# Patient Record
Sex: Female | Born: 1940
Health system: Southern US, Community
[De-identification: ages and names within clinical notes are randomized; demographics above are authoritative.]

## PROBLEM LIST (undated history)

## (undated) DIAGNOSIS — Z972 Presence of dental prosthetic device (complete) (partial): Secondary | ICD-10-CM

## (undated) DIAGNOSIS — R7303 Prediabetes: Secondary | ICD-10-CM

## (undated) DIAGNOSIS — I10 Essential (primary) hypertension: Secondary | ICD-10-CM

## (undated) DIAGNOSIS — M1990 Unspecified osteoarthritis, unspecified site: Secondary | ICD-10-CM

## (undated) HISTORY — DX: Prediabetes: R73.03

## (undated) HISTORY — DX: Unspecified osteoarthritis, unspecified site: M19.90

## (undated) HISTORY — DX: Essential (primary) hypertension: I10

## (undated) HISTORY — PX: TUBAL LIGATION: SHX77

## (undated) HISTORY — PX: COLONOSCOPY: SHX174

---

## 2008-04-01 ENCOUNTER — Ambulatory Visit: Payer: Self-pay | Admitting: Internal Medicine

## 2008-04-15 ENCOUNTER — Ambulatory Visit: Payer: Self-pay | Admitting: Internal Medicine

## 2011-06-30 ENCOUNTER — Ambulatory Visit: Payer: Self-pay | Admitting: Gastroenterology

## 2011-07-01 LAB — PATHOLOGY REPORT

## 2012-10-19 ENCOUNTER — Ambulatory Visit: Payer: Self-pay | Admitting: Family Medicine

## 2013-11-26 ENCOUNTER — Ambulatory Visit: Payer: Self-pay | Admitting: Family Medicine

## 2014-08-07 ENCOUNTER — Ambulatory Visit: Payer: Self-pay | Admitting: Gastroenterology

## 2014-08-07 DIAGNOSIS — Z86010 Personal history of colonic polyps: Secondary | ICD-10-CM | POA: Diagnosis not present

## 2014-08-07 DIAGNOSIS — Z1211 Encounter for screening for malignant neoplasm of colon: Secondary | ICD-10-CM | POA: Diagnosis not present

## 2014-12-03 ENCOUNTER — Other Ambulatory Visit: Payer: Self-pay | Admitting: Family Medicine

## 2014-12-03 DIAGNOSIS — Z1231 Encounter for screening mammogram for malignant neoplasm of breast: Secondary | ICD-10-CM

## 2014-12-03 DIAGNOSIS — Z0000 Encounter for general adult medical examination without abnormal findings: Secondary | ICD-10-CM | POA: Diagnosis not present

## 2014-12-03 DIAGNOSIS — E119 Type 2 diabetes mellitus without complications: Secondary | ICD-10-CM | POA: Diagnosis not present

## 2014-12-03 DIAGNOSIS — E785 Hyperlipidemia, unspecified: Secondary | ICD-10-CM | POA: Diagnosis not present

## 2014-12-03 DIAGNOSIS — I10 Essential (primary) hypertension: Secondary | ICD-10-CM | POA: Diagnosis not present

## 2014-12-17 ENCOUNTER — Other Ambulatory Visit: Payer: Self-pay | Admitting: Family Medicine

## 2014-12-17 ENCOUNTER — Ambulatory Visit
Admission: RE | Admit: 2014-12-17 | Discharge: 2014-12-17 | Disposition: A | Discharge status: Home / Self Care | Payer: Commercial Managed Care - HMO | Source: Ambulatory Visit | Attending: Family Medicine | Admitting: Family Medicine

## 2014-12-17 DIAGNOSIS — Z1231 Encounter for screening mammogram for malignant neoplasm of breast: Secondary | ICD-10-CM

## 2015-04-03 DIAGNOSIS — B9689 Other specified bacterial agents as the cause of diseases classified elsewhere: Secondary | ICD-10-CM | POA: Diagnosis not present

## 2015-04-03 DIAGNOSIS — J208 Acute bronchitis due to other specified organisms: Secondary | ICD-10-CM | POA: Diagnosis not present

## 2015-06-04 DIAGNOSIS — E119 Type 2 diabetes mellitus without complications: Secondary | ICD-10-CM | POA: Diagnosis not present

## 2015-06-11 DIAGNOSIS — I10 Essential (primary) hypertension: Secondary | ICD-10-CM | POA: Diagnosis not present

## 2015-06-11 DIAGNOSIS — N390 Urinary tract infection, site not specified: Secondary | ICD-10-CM | POA: Diagnosis not present

## 2015-06-11 DIAGNOSIS — E119 Type 2 diabetes mellitus without complications: Secondary | ICD-10-CM | POA: Diagnosis not present

## 2015-06-18 DIAGNOSIS — H5211 Myopia, right eye: Secondary | ICD-10-CM | POA: Diagnosis not present

## 2015-06-18 DIAGNOSIS — H5210 Myopia, unspecified eye: Secondary | ICD-10-CM | POA: Diagnosis not present

## 2015-11-19 ENCOUNTER — Other Ambulatory Visit: Payer: Self-pay | Admitting: Family Medicine

## 2015-11-19 DIAGNOSIS — Z1231 Encounter for screening mammogram for malignant neoplasm of breast: Secondary | ICD-10-CM

## 2015-12-03 DIAGNOSIS — E119 Type 2 diabetes mellitus without complications: Secondary | ICD-10-CM | POA: Diagnosis not present

## 2015-12-03 DIAGNOSIS — I10 Essential (primary) hypertension: Secondary | ICD-10-CM | POA: Diagnosis not present

## 2015-12-10 DIAGNOSIS — E119 Type 2 diabetes mellitus without complications: Secondary | ICD-10-CM | POA: Diagnosis not present

## 2015-12-10 DIAGNOSIS — J309 Allergic rhinitis, unspecified: Secondary | ICD-10-CM | POA: Diagnosis not present

## 2015-12-10 DIAGNOSIS — Z23 Encounter for immunization: Secondary | ICD-10-CM | POA: Diagnosis not present

## 2015-12-10 DIAGNOSIS — E785 Hyperlipidemia, unspecified: Secondary | ICD-10-CM | POA: Diagnosis not present

## 2015-12-10 DIAGNOSIS — Z0000 Encounter for general adult medical examination without abnormal findings: Secondary | ICD-10-CM | POA: Diagnosis not present

## 2015-12-10 DIAGNOSIS — I10 Essential (primary) hypertension: Secondary | ICD-10-CM | POA: Diagnosis not present

## 2015-12-18 ENCOUNTER — Ambulatory Visit
Admission: RE | Admit: 2015-12-18 | Discharge: 2015-12-18 | Disposition: A | Discharge status: Home / Self Care | Payer: Commercial Managed Care - HMO | Source: Ambulatory Visit | Attending: Family Medicine | Admitting: Family Medicine

## 2015-12-18 ENCOUNTER — Other Ambulatory Visit: Payer: Self-pay | Admitting: Family Medicine

## 2015-12-18 DIAGNOSIS — Z1231 Encounter for screening mammogram for malignant neoplasm of breast: Secondary | ICD-10-CM | POA: Diagnosis not present

## 2016-07-05 DIAGNOSIS — H43813 Vitreous degeneration, bilateral: Secondary | ICD-10-CM | POA: Diagnosis not present

## 2016-07-05 DIAGNOSIS — H524 Presbyopia: Secondary | ICD-10-CM | POA: Diagnosis not present

## 2016-07-05 DIAGNOSIS — H52223 Regular astigmatism, bilateral: Secondary | ICD-10-CM | POA: Diagnosis not present

## 2016-07-05 DIAGNOSIS — H5202 Hypermetropia, left eye: Secondary | ICD-10-CM | POA: Diagnosis not present

## 2016-07-05 DIAGNOSIS — H25813 Combined forms of age-related cataract, bilateral: Secondary | ICD-10-CM | POA: Diagnosis not present

## 2016-07-05 DIAGNOSIS — R739 Hyperglycemia, unspecified: Secondary | ICD-10-CM | POA: Diagnosis not present

## 2016-07-05 DIAGNOSIS — H5211 Myopia, right eye: Secondary | ICD-10-CM | POA: Diagnosis not present

## 2016-08-26 DIAGNOSIS — J0100 Acute maxillary sinusitis, unspecified: Secondary | ICD-10-CM | POA: Diagnosis not present

## 2016-11-11 ENCOUNTER — Other Ambulatory Visit: Payer: Self-pay | Admitting: Family Medicine

## 2016-11-11 DIAGNOSIS — Z1231 Encounter for screening mammogram for malignant neoplasm of breast: Secondary | ICD-10-CM

## 2016-11-21 DIAGNOSIS — I10 Essential (primary) hypertension: Secondary | ICD-10-CM | POA: Diagnosis not present

## 2016-11-21 DIAGNOSIS — R42 Dizziness and giddiness: Secondary | ICD-10-CM | POA: Diagnosis not present

## 2016-12-05 DIAGNOSIS — Z0000 Encounter for general adult medical examination without abnormal findings: Secondary | ICD-10-CM | POA: Diagnosis not present

## 2016-12-05 DIAGNOSIS — E119 Type 2 diabetes mellitus without complications: Secondary | ICD-10-CM | POA: Diagnosis not present

## 2016-12-12 DIAGNOSIS — E785 Hyperlipidemia, unspecified: Secondary | ICD-10-CM | POA: Diagnosis not present

## 2016-12-12 DIAGNOSIS — E119 Type 2 diabetes mellitus without complications: Secondary | ICD-10-CM | POA: Diagnosis not present

## 2016-12-12 DIAGNOSIS — I10 Essential (primary) hypertension: Secondary | ICD-10-CM | POA: Diagnosis not present

## 2016-12-12 DIAGNOSIS — Z0000 Encounter for general adult medical examination without abnormal findings: Secondary | ICD-10-CM | POA: Diagnosis not present

## 2016-12-19 ENCOUNTER — Encounter (INDEPENDENT_AMBULATORY_CARE_PROVIDER_SITE_OTHER): Payer: Self-pay

## 2016-12-19 ENCOUNTER — Ambulatory Visit
Admission: RE | Admit: 2016-12-19 | Discharge: 2016-12-19 | Disposition: A | Discharge status: Home / Self Care | Payer: Medicare HMO | Source: Ambulatory Visit | Attending: Family Medicine | Admitting: Family Medicine

## 2016-12-19 DIAGNOSIS — Z1231 Encounter for screening mammogram for malignant neoplasm of breast: Secondary | ICD-10-CM | POA: Diagnosis not present

## 2017-11-10 ENCOUNTER — Other Ambulatory Visit: Payer: Self-pay | Admitting: Family Medicine

## 2017-11-10 DIAGNOSIS — Z1231 Encounter for screening mammogram for malignant neoplasm of breast: Secondary | ICD-10-CM

## 2017-12-20 ENCOUNTER — Encounter (INDEPENDENT_AMBULATORY_CARE_PROVIDER_SITE_OTHER): Payer: Self-pay

## 2017-12-20 ENCOUNTER — Ambulatory Visit
Admission: RE | Admit: 2017-12-20 | Discharge: 2017-12-20 | Disposition: A | Discharge status: Home / Self Care | Payer: Medicare Other | Source: Ambulatory Visit | Attending: Family Medicine | Admitting: Family Medicine

## 2017-12-20 ENCOUNTER — Inpatient Hospital Stay: Admission: RE | Admit: 2017-12-20 | Payer: Medicare HMO | Source: Ambulatory Visit

## 2017-12-20 DIAGNOSIS — Z1231 Encounter for screening mammogram for malignant neoplasm of breast: Secondary | ICD-10-CM | POA: Insufficient documentation

## 2018-11-27 ENCOUNTER — Other Ambulatory Visit: Payer: Self-pay | Admitting: Family Medicine

## 2018-11-27 DIAGNOSIS — Z1231 Encounter for screening mammogram for malignant neoplasm of breast: Secondary | ICD-10-CM

## 2018-12-25 ENCOUNTER — Ambulatory Visit
Admission: RE | Admit: 2018-12-25 | Discharge: 2018-12-25 | Disposition: A | Discharge status: Home / Self Care | Payer: Medicare Other | Source: Ambulatory Visit | Attending: Family Medicine | Admitting: Family Medicine

## 2018-12-25 ENCOUNTER — Other Ambulatory Visit: Payer: Self-pay

## 2018-12-25 DIAGNOSIS — Z1231 Encounter for screening mammogram for malignant neoplasm of breast: Secondary | ICD-10-CM | POA: Insufficient documentation

## 2019-07-23 ENCOUNTER — Ambulatory Visit: Payer: Self-pay | Admitting: Family Medicine

## 2019-08-01 ENCOUNTER — Ambulatory Visit (INDEPENDENT_AMBULATORY_CARE_PROVIDER_SITE_OTHER): Payer: Medicare Other | Admitting: Family Medicine

## 2019-08-01 ENCOUNTER — Other Ambulatory Visit: Payer: Self-pay

## 2019-08-01 ENCOUNTER — Other Ambulatory Visit: Payer: Self-pay | Admitting: Family Medicine

## 2019-08-01 ENCOUNTER — Encounter: Payer: Self-pay | Admitting: Family Medicine

## 2019-08-01 VITALS — BP 162/89 | HR 93 | Temp 98.1°F | Ht 65.5 in | Wt 197.0 lb

## 2019-08-01 DIAGNOSIS — Z7689 Persons encountering health services in other specified circumstances: Secondary | ICD-10-CM

## 2019-08-01 DIAGNOSIS — R7301 Impaired fasting glucose: Secondary | ICD-10-CM | POA: Diagnosis not present

## 2019-08-01 DIAGNOSIS — I10 Essential (primary) hypertension: Secondary | ICD-10-CM | POA: Diagnosis not present

## 2019-08-01 MED ORDER — AMLODIPINE BESYLATE 5 MG PO TABS: 5.0000 mg | ORAL_TABLET | Freq: Every day | ORAL | 0 refills | Status: DC

## 2019-08-01 NOTE — Assessment & Plan Note (Signed)
BPs frequently above goal. Increase to 5 mg amlodipine and recheck in 1 month. Start logging home readings. Call with persistent abnormal readings

## 2019-08-01 NOTE — Assessment & Plan Note (Signed)
Diet controlled. Continue excellent lifestyle modifications, repeat labs at upcoming appt

## 2019-08-01 NOTE — Progress Notes (Signed)
   BP (!) 162/89   Pulse 93   Temp 98.1 F (36.7 C) (Oral)   Ht 5' 5.5" (1.664 m)   Wt 197 lb (89.4 kg)   SpO2 99%   BMI 32.28 kg/m    Subjective:    Patient ID: Carol Armstrong, female    DOB: 1941/01/13, 79 y.o.   MRN: [id number]  HPI: Carol Armstrong is a 79 y.o. female  Chief Complaint  Patient presents with  . Establish Care   Patient presenting today to establish care.   PMHx of HTN and IFG. On low dose amlodipine and a multivitamin. BSs controlled with diet. Denies CP, SOB, HAs, dizziness, low blood sugar spells, polyuria, polydipsia. Last labs stable 3 months ago. Of note, does not check home BPs but per past record review BPs running high normal to high.   No new concerns today. Overdue for CPE.   Relevant past medical, surgical, family and social history reviewed and updated as indicated. Interim medical history since our last visit reviewed. Allergies and medications reviewed and updated.  Review of Systems  Per HPI unless specifically indicated above     Objective:    BP (!) 162/89   Pulse 93   Temp 98.1 F (36.7 C) (Oral)   Ht 5' 5.5" (1.664 m)   Wt 197 lb (89.4 kg)   SpO2 99%   BMI 32.28 kg/m   Wt Readings from Last 3 Encounters:  08/01/19 197 lb (89.4 kg)    Physical Exam Vitals and nursing note reviewed.  Constitutional:      Appearance: Normal appearance. She is not ill-appearing.  HENT:     Head: Atraumatic.  Eyes:     Extraocular Movements: Extraocular movements intact.     Conjunctiva/sclera: Conjunctivae normal.  Cardiovascular:     Rate and Rhythm: Normal rate and regular rhythm.     Heart sounds: Normal heart sounds.  Pulmonary:     Effort: Pulmonary effort is normal.     Breath sounds: Normal breath sounds.  Musculoskeletal:        General: Normal range of motion.     Cervical back: Normal range of motion and neck supple.  Skin:    General: Skin is warm and dry.  Neurological:     Mental Status: She is alert and oriented to  person, place, and time.  Psychiatric:        Mood and Affect: Mood normal.        Thought Content: Thought content normal.        Judgment: Judgment normal.     No results found for this or any previous visit.    Assessment & Plan:   Problem List Items Addressed This Visit      Cardiovascular and Mediastinum   Essential hypertension - Primary    BPs frequently above goal. Increase to 5 mg amlodipine and recheck in 1 month. Start logging home readings. Call with persistent abnormal readings        Endocrine   IFG (impaired fasting glucose)    Diet controlled. Continue excellent lifestyle modifications, repeat labs at upcoming appt       Other Visit Diagnoses    Encounter to establish care           Follow up plan: Return in about 4 weeks (around 08/29/2019) for CPE.

## 2019-08-28 ENCOUNTER — Other Ambulatory Visit: Payer: Self-pay | Admitting: Family Medicine

## 2019-08-28 ENCOUNTER — Telehealth: Payer: Self-pay

## 2019-08-28 DIAGNOSIS — Z1211 Encounter for screening for malignant neoplasm of colon: Secondary | ICD-10-CM

## 2019-08-28 NOTE — Telephone Encounter (Signed)
Gastroenterology Pre-Procedure Review  Request Date: 10/07/19 Requesting Physician: Dr. Allen Norris  PATIENT REVIEW QUESTIONS: The patient responded to the following health history questions as indicated:    1. Are you having any GI issues? no 2. Do you have a personal history of Polyps? no 3. Do you have a family history of Colon Cancer or Polyps? no 4. Diabetes Mellitus? no 5. Joint replacements in the past 12 months?no 6. Major health problems in the past 3 months?no 7. Any artificial heart valves, MVP, or defibrillator?no    MEDICATIONS & ALLERGIES:    Patient reports the following regarding taking any anticoagulation/antiplatelet therapy:   Plavix, Coumadin, Eliquis, Xarelto, Lovenox, Pradaxa, Brilinta, or Effient? no Aspirin? no  Patient confirms/reports the following medications:  Current Outpatient Medications  Medication Sig Dispense Refill  . amLODipine (NORVASC) 5 MG tablet TAKE 1 TABLET(5 MG) BY MOUTH DAILY 90 tablet 0  . Multiple Vitamin (MULTIVITAMIN) capsule Take by mouth.     No current facility-administered medications for this visit.    Patient confirms/reports the following allergies:  Allergies  Allergen Reactions  . Latex Itching and Swelling    No orders of the defined types were placed in this encounter.   AUTHORIZATION INFORMATION Primary Insurance: 1D#: Group #:  Secondary Insurance: 1D#: Group #:  SCHEDULE INFORMATION: Date: 10/07/19 Time: Location:[facility]

## 2019-08-28 NOTE — Telephone Encounter (Signed)
Returned patients call to schedule her colonoscopy.  After scheduling, realized we did not have a referral for her.  I've sent a secure chat to Merrie Roof her PCP to send referral for her colonoscopy.  Thanks,  [place], [place]

## 2019-08-29 ENCOUNTER — Other Ambulatory Visit: Payer: Self-pay

## 2019-08-29 ENCOUNTER — Ambulatory Visit (INDEPENDENT_AMBULATORY_CARE_PROVIDER_SITE_OTHER): Payer: Medicare Other | Admitting: Family Medicine

## 2019-08-29 ENCOUNTER — Encounter: Payer: Self-pay | Admitting: Family Medicine

## 2019-08-29 VITALS — BP 144/80 | HR 96 | Temp 98.0°F | Ht 66.0 in | Wt 198.0 lb

## 2019-08-29 DIAGNOSIS — Z6831 Body mass index (BMI) 31.0-31.9, adult: Secondary | ICD-10-CM | POA: Diagnosis not present

## 2019-08-29 DIAGNOSIS — E6609 Other obesity due to excess calories: Secondary | ICD-10-CM

## 2019-08-29 DIAGNOSIS — Z23 Encounter for immunization: Secondary | ICD-10-CM | POA: Diagnosis not present

## 2019-08-29 DIAGNOSIS — R7301 Impaired fasting glucose: Secondary | ICD-10-CM | POA: Diagnosis not present

## 2019-08-29 DIAGNOSIS — E669 Obesity, unspecified: Secondary | ICD-10-CM | POA: Insufficient documentation

## 2019-08-29 DIAGNOSIS — I10 Essential (primary) hypertension: Secondary | ICD-10-CM

## 2019-08-29 DIAGNOSIS — Z0000 Encounter for general adult medical examination without abnormal findings: Secondary | ICD-10-CM

## 2019-08-29 DIAGNOSIS — Z1211 Encounter for screening for malignant neoplasm of colon: Secondary | ICD-10-CM

## 2019-08-29 NOTE — Progress Notes (Signed)
BP (!) 144/80   Pulse 96   Temp 98 F (36.7 C)   Ht 5\' 6"  (1.676 m)   Wt 198 lb (89.8 kg)   SpO2 97%   BMI 31.96 kg/m    Subjective:    Patient ID: Carol Armstrong, female    DOB: [birth year], 79 y.o.   MRN: [id number]  HPI: Carol Armstrong is a 79 y.o. female presenting on 08/29/2019 for comprehensive medical examination. Current medical complaints include:see below  HTN - Home readings running 120-130/80s typically with some occasional high readings. Taking amlodipine faithfully without side effects. Denies CP, SOB, HAs, dizziness.   IFG - Diet controlled, not checking home sugars. Eating well and staying very active.   She currently lives with: Menopausal Symptoms: no  Depression Screen done today and results listed below:  Depression screen [facility] 2/9 08/29/2019 08/01/2019  Decreased Interest 0 0  Down, Depressed, Hopeless 0 0  PHQ - 2 Score 0 0  Altered sleeping 0 0  Tired, decreased energy 0 0  Change in appetite 0 0  Feeling bad or failure about yourself  0 0  Trouble concentrating 0 0  Moving slowly or fidgety/restless 0 0  Suicidal thoughts 0 0  PHQ-9 Score 0 0    The patient does not have a history of falls. I did complete a risk assessment for falls. A plan of care for falls was documented.   Past Medical History:  Past Medical History:  Diagnosis Date  . Hypertension   . Prediabetes     Surgical History:  Past Surgical History:  Procedure Laterality Date  . COLONOSCOPY    . TUBAL LIGATION      Medications:  Current Outpatient Medications on File Prior to Visit  Medication Sig  . amLODipine (NORVASC) 5 MG tablet TAKE 1 TABLET(5 MG) BY MOUTH DAILY  . Multiple Vitamin (MULTIVITAMIN) capsule Take by mouth.   No current facility-administered medications on file prior to visit.    Allergies:  Allergies  Allergen Reactions  . Latex Itching and Swelling    Social History:  Social History   Socioeconomic History  . Marital status: Divorced   Spouse name: Not on file  . Number of children: Not on file  . Years of education: Not on file  . Highest education level: Not on file  Occupational History  . Not on file  Tobacco Use  . Smoking status: Former Research scientist (life sciences)  . Smokeless tobacco: Never Used  Substance and Sexual Activity  . Alcohol use: Yes    Comment: occasinally wine  . Drug use: Never  . Sexual activity: Not Currently  Other Topics Concern  . Not on file  Social History Narrative  . Not on file   Social Determinants of Health   Financial Resource Strain:   . Difficulty of Paying Living Expenses:   Food Insecurity:   . Worried About Charity fundraiser in the Last Year:   . Arboriculturist in the Last Year:   Transportation Needs:   . Film/video editor (Medical):   Marland Kitchen Lack of Transportation (Non-Medical):   Physical Activity:   . Days of Exercise per Week:   . Minutes of Exercise per Session:   Stress:   . Feeling of Stress :   Social Connections:   . Frequency of Communication with Friends and Family:   . Frequency of Social Gatherings with Friends and Family:   . Attends Religious Services:   . Active Member  of Clubs or Organizations:   . Attends Archivist Meetings:   Marland Kitchen Marital Status:   Intimate Partner Violence:   . Fear of Current or Ex-Partner:   . Emotionally Abused:   Marland Kitchen Physically Abused:   . Sexually Abused:    Social History   Tobacco Use  Smoking Status Former Smoker  Smokeless Tobacco Never Used   Social History   Substance and Sexual Activity  Alcohol Use Yes   Comment: occasinally wine    Family History:  Family History  Problem Relation Age of Onset  . Hypertension Mother   . Prostate cancer Father   . Hypertension Sister   . Hypertension Brother   . Hypertension Sister   . Hypertension Sister   . Hypertension Sister   . Breast cancer Neg Hx     Past medical history, surgical history, medications, allergies, family history and social history reviewed  with patient today and changes made to appropriate areas of the chart.   Review of Systems - General ROS: negative Psychological ROS: negative Ophthalmic ROS: negative ENT ROS: negative Allergy and Immunology ROS: negative Hematological and Lymphatic ROS: negative Endocrine ROS: negative Breast ROS: negative for breast lumps Respiratory ROS: no cough, shortness of breath, or wheezing Cardiovascular ROS: no chest pain or dyspnea on exertion Gastrointestinal ROS: no abdominal pain, change in bowel habits, or black or bloody stools Genito-Urinary ROS: no dysuria, trouble voiding, or hematuria Musculoskeletal ROS: negative Neurological ROS: no TIA or stroke symptoms Dermatological ROS: negative All other ROS negative except what is listed above and in the HPI.      Objective:    BP (!) 144/80   Pulse 96   Temp 98 F (36.7 C)   Ht 5\' 6"  (1.676 m)   Wt 198 lb (89.8 kg)   SpO2 97%   BMI 31.96 kg/m   Wt Readings from Last 3 Encounters:  08/29/19 198 lb (89.8 kg)  08/01/19 197 lb (89.4 kg)    Physical Exam Vitals and nursing note reviewed.  Constitutional:      General: She is not in acute distress.    Appearance: She is well-developed.  HENT:     Head: Atraumatic.     Right Ear: External ear normal.     Left Ear: External ear normal.     Nose: Nose normal.     Mouth/Throat:     Pharynx: No oropharyngeal exudate.  Eyes:     General: No scleral icterus.    Conjunctiva/sclera: Conjunctivae normal.     Pupils: Pupils are equal, round, and reactive to light.  Neck:     Thyroid: No thyromegaly.  Cardiovascular:     Rate and Rhythm: Normal rate and regular rhythm.     Heart sounds: Normal heart sounds.  Pulmonary:     Effort: Pulmonary effort is normal. No respiratory distress.     Breath sounds: Normal breath sounds.  Chest:     Breasts:        Right: No mass, skin change or tenderness.        Left: No mass, skin change or tenderness.  Abdominal:     General: Bowel  sounds are normal.     Palpations: Abdomen is soft. There is no mass.     Tenderness: There is no abdominal tenderness.  Genitourinary:    Comments: Gu exam declined Musculoskeletal:        General: No tenderness. Normal range of motion.     Cervical back: Normal range  of motion and neck supple.  Lymphadenopathy:     Cervical: No cervical adenopathy.     Upper Body:     Right upper body: No axillary adenopathy.     Left upper body: No axillary adenopathy.  Skin:    General: Skin is warm and dry.     Findings: No rash.  Neurological:     Mental Status: She is alert and oriented to person, place, and time.     Cranial Nerves: No cranial nerve deficit.  Psychiatric:        Behavior: Behavior normal.     Results for orders placed or performed in visit on 08/29/19  Microscopic Examination   URINE  Result Value Ref Range   WBC, UA 0-5 0 - 5 /hpf   RBC 0-2 0 - 2 /hpf   Epithelial Cells (non renal) 0-10 0 - 10 /hpf   Bacteria, UA Few (A) None seen/Few  Urine Culture, Reflex   URINE  Result Value Ref Range   Urine Culture, Routine Final report    Organism ID, Bacteria Comment   CBC with Differential/Platelet  Result Value Ref Range   WBC 5.9 3.4 - 10.8 x10E3/uL   RBC 4.73 3.77 - 5.28 x10E6/uL   Hemoglobin 14.3 11.1 - 15.9 g/dL   Hematocrit 41.8 34.0 - 46.6 %   MCV 88 79 - 97 fL   MCH 30.2 26.6 - 33.0 pg   MCHC 34.2 31.5 - 35.7 g/dL   RDW 12.6 11.7 - 15.4 %   Platelets 206 150 - 450 x10E3/uL   Neutrophils 63 Not Estab. %   Lymphs 26 Not Estab. %   Monocytes 7 Not Estab. %   Eos 3 Not Estab. %   Basos 1 Not Estab. %   Neutrophils Absolute 3.7 1.4 - 7.0 x10E3/uL   Lymphocytes Absolute 1.5 0.7 - 3.1 x10E3/uL   Monocytes Absolute 0.4 0.1 - 0.9 x10E3/uL   EOS (ABSOLUTE) 0.2 0.0 - 0.4 x10E3/uL   Basophils Absolute 0.0 0.0 - 0.2 x10E3/uL   Immature Granulocytes 0 Not Estab. %   Immature Grans (Abs) 0.0 0.0 - 0.1 x10E3/uL  Comprehensive metabolic panel  Result Value Ref  Range   Glucose 92 65 - 99 mg/dL   BUN 14 8 - 27 mg/dL   Creatinine, Ser 1.01 (H) 0.57 - 1.00 mg/dL   GFR calc non Af Amer 54 (L) >59 mL/min/1.73   GFR calc Af Amer 62 >59 mL/min/1.73   BUN/Creatinine Ratio 14 12 - 28   Sodium 144 134 - 144 mmol/L   Potassium 3.9 3.5 - 5.2 mmol/L   Chloride 106 96 - 106 mmol/L   CO2 27 20 - 29 mmol/L   Calcium 9.0 8.7 - 10.3 mg/dL   Total Protein 6.9 6.0 - 8.5 g/dL   Albumin 4.3 3.7 - 4.7 g/dL   Globulin, Total 2.6 1.5 - 4.5 g/dL   Albumin/Globulin Ratio 1.7 1.2 - 2.2   Bilirubin Total 0.7 0.0 - 1.2 mg/dL   Alkaline Phosphatase 58 39 - 117 IU/L   AST 23 0 - 40 IU/L   ALT 21 0 - 32 IU/L  Lipid Panel w/o Chol/HDL Ratio  Result Value Ref Range   Cholesterol, Total 188 100 - 199 mg/dL   Triglycerides 122 0 - 149 mg/dL   HDL 60 >39 mg/dL   VLDL Cholesterol Cal 22 5 - 40 mg/dL   LDL Chol Calc (NIH) 106 (H) 0 - 99 mg/dL  TSH  Result Value Ref  Range   TSH 1.910 0.450 - 4.500 uIU/mL  UA/M w/rflx Culture, Routine   Specimen: Urine   URINE  Result Value Ref Range   Specific Gravity, UA 1.025 1.005 - 1.030   pH, UA 7.0 5.0 - 7.5   Color, UA Yellow Yellow   Appearance Ur Clear Clear   Leukocytes,UA Trace (A) Negative   Protein,UA Negative Negative/Trace   Glucose, UA Negative Negative   Ketones, UA Negative Negative   RBC, UA Trace (A) Negative   Bilirubin, UA Negative Negative   Urobilinogen, Ur 0.2 0.2 - 1.0 mg/dL   Nitrite, UA Negative Negative   Microscopic Examination See below:    Urinalysis Reflex Comment   HgB A1c  Result Value Ref Range   Hgb A1c MFr Bld 6.0 (H) 4.8 - 5.6 %   Est. average glucose Bld gHb Est-mCnc 126 mg/dL  Microalbumin, Urine Waived  Result Value Ref Range   Microalb, Ur Waived 30 (H) 0 - 19 mg/L   Creatinine, Urine Waived 100 10 - 300 mg/dL   Microalb/Creat Ratio <30 <30 mg/g      Assessment & Plan:   Problem List Items Addressed This Visit      Cardiovascular and Mediastinum   Essential hypertension -  Primary    BPs high normal to just above normal range. Increase to 5 mg amlodipine and continue to monitor. DASH diet, exercise      Relevant Orders   CBC with Differential/Platelet (Completed)   Comprehensive metabolic panel (Completed)   TSH (Completed)   UA/M w/rflx Culture, Routine (Completed)     Endocrine   IFG (impaired fasting glucose)    Recheck A1C, adjust as needed. Continue good diet and exercise      Relevant Orders   HgB A1c (Completed)   Microalbumin, Urine Waived (Completed)     Other   Obesity    Check lipids, continue good diet and exercise habits      Relevant Orders   Lipid Panel w/o Chol/HDL Ratio (Completed)    Other Visit Diagnoses    Annual physical exam       Need for pneumococcal vaccine       Relevant Orders   Pneumococcal polysaccharide vaccine 23-valent greater than or equal to 2yo subcutaneous/IM (Completed)       Follow up plan: Return in about 6 months (around 02/29/2020) for 6 month f/u.   LABORATORY TESTING:  - Pap smear: not applicable  IMMUNIZATIONS:   - Tdap: Tetanus vaccination status reviewed: postponed. - Influenza: Up to date - Pneumovax: Up to date - Prevnar: Up to date - HPV: Not applicable - Zostavax vaccine: postponed  SCREENING: -Mammogram: Up to date  - Colonoscopy: postponed  - Bone Density: Up to date   PATIENT COUNSELING:   Advised to take 1 mg of folate supplement per day if capable of pregnancy.   Sexuality: Discussed sexually transmitted diseases, partner selection, use of condoms, avoidance of unintended pregnancy  and contraceptive alternatives.   Advised to avoid cigarette smoking.  I discussed with the patient that most people either abstain from alcohol or drink within safe limits (<=14/week and <=4 drinks/occasion for males, <=7/weeks and <= 3 drinks/occasion for females) and that the risk for alcohol disorders and other health effects rises proportionally with the number of drinks per week and how  often a drinker exceeds daily limits.  Discussed cessation/primary prevention of drug use and availability of treatment for abuse.   Diet: Encouraged to adjust caloric intake to  maintain  or achieve ideal body weight, to reduce intake of dietary saturated fat and total fat, to limit sodium intake by avoiding high sodium foods and not adding table salt, and to maintain adequate dietary potassium and calcium preferably from fresh fruits, vegetables, and low-fat dairy products.    stressed the importance of regular exercise  Injury prevention: Discussed safety belts, safety helmets, smoke detector, smoking near bedding or upholstery.   Dental health: Discussed importance of regular tooth brushing, flossing, and dental visits.    NEXT PREVENTATIVE PHYSICAL DUE IN 1 YEAR. Return in about 6 months (around 02/29/2020) for 6 month f/u.

## 2019-08-30 LAB — COMPREHENSIVE METABOLIC PANEL
ALT: 21 IU/L (ref 0–32)
AST: 23 IU/L (ref 0–40)
Albumin/Globulin Ratio: 1.7 (ref 1.2–2.2)
Albumin: 4.3 g/dL (ref 3.7–4.7)
Alkaline Phosphatase: 58 IU/L (ref 39–117)
BUN/Creatinine Ratio: 14 (ref 12–28)
BUN: 14 mg/dL (ref 8–27)
Bilirubin Total: 0.7 mg/dL (ref 0.0–1.2)
CO2: 27 mmol/L (ref 20–29)
Calcium: 9 mg/dL (ref 8.7–10.3)
Chloride: 106 mmol/L (ref 96–106)
Creatinine, Ser: 1.01 mg/dL — ABNORMAL HIGH (ref 0.57–1.00)
GFR calc Af Amer: 62 mL/min/{1.73_m2} (ref >59)
GFR calc non Af Amer: 54 mL/min/{1.73_m2} — ABNORMAL LOW (ref >59)
Globulin, Total: 2.6 g/dL (ref 1.5–4.5)
Glucose: 92 mg/dL (ref 65–99)
Potassium: 3.9 mmol/L (ref 3.5–5.2)
Sodium: 144 mmol/L (ref 134–144)
Total Protein: 6.9 g/dL (ref 6.0–8.5)

## 2019-08-30 LAB — CBC WITH DIFFERENTIAL/PLATELET
Basophils Absolute: 0 10*3/uL (ref 0.0–0.2)
Basos: 1 %
EOS (ABSOLUTE): 0.2 10*3/uL (ref 0.0–0.4)
Eos: 3 %
Hematocrit: 41.8 % (ref 34.0–46.6)
Hemoglobin: 14.3 g/dL (ref 11.1–15.9)
Immature Grans (Abs): 0 10*3/uL (ref 0.0–0.1)
Immature Granulocytes: 0 %
Lymphocytes Absolute: 1.5 10*3/uL (ref 0.7–3.1)
Lymphs: 26 %
MCH: 30.2 pg (ref 26.6–33.0)
MCHC: 34.2 g/dL (ref 31.5–35.7)
MCV: 88 fL (ref 79–97)
Monocytes Absolute: 0.4 10*3/uL (ref 0.1–0.9)
Monocytes: 7 %
Neutrophils Absolute: 3.7 10*3/uL (ref 1.4–7.0)
Neutrophils: 63 %
Platelets: 206 10*3/uL (ref 150–450)
RBC: 4.73 x10E6/uL (ref 3.77–5.28)
RDW: 12.6 % (ref 11.7–15.4)
WBC: 5.9 10*3/uL (ref 3.4–10.8)

## 2019-08-30 LAB — HEMOGLOBIN A1C
Est. average glucose Bld gHb Est-mCnc: 126 mg/dL
Hgb A1c MFr Bld: 6 % — ABNORMAL HIGH (ref 4.8–5.6)

## 2019-08-30 LAB — LIPID PANEL W/O CHOL/HDL RATIO
Cholesterol, Total: 188 mg/dL (ref 100–199)
HDL: 60 mg/dL (ref >39)
LDL Chol Calc (NIH): 106 mg/dL — ABNORMAL HIGH (ref 0–99)
Triglycerides: 122 mg/dL (ref 0–149)
VLDL Cholesterol Cal: 22 mg/dL (ref 5–40)

## 2019-08-30 LAB — TSH: TSH: 1.91 u[IU]/mL (ref 0.450–4.500)

## 2019-08-31 LAB — MICROSCOPIC EXAMINATION

## 2019-08-31 LAB — MICROALBUMIN, URINE WAIVED
Creatinine, Urine Waived: 100 mg/dL (ref 10–300)
Microalb, Ur Waived: 30 mg/L — ABNORMAL HIGH (ref 0–19)
Microalb/Creat Ratio: <30 mg/g (ref <30)

## 2019-08-31 LAB — UA/M W/RFLX CULTURE, ROUTINE
Bilirubin, UA: NEGATIVE
Glucose, UA: NEGATIVE
Ketones, UA: NEGATIVE
Nitrite, UA: NEGATIVE
Protein,UA: NEGATIVE
Specific Gravity, UA: 1.025 (ref 1.005–1.030)
Urobilinogen, Ur: 0.2 mg/dL (ref 0.2–1.0)
pH, UA: 7 (ref 5.0–7.5)

## 2019-08-31 LAB — URINE CULTURE, REFLEX

## 2019-09-03 NOTE — Assessment & Plan Note (Signed)
BPs high normal to just above normal range. Increase to 5 mg amlodipine and continue to monitor. DASH diet, exercise

## 2019-09-03 NOTE — Assessment & Plan Note (Addendum)
Recheck A1C, adjust as needed. Continue good diet and exercise 

## 2019-09-03 NOTE — Assessment & Plan Note (Signed)
Check lipids, continue good diet and exercise habits

## 2019-09-30 ENCOUNTER — Encounter: Payer: Self-pay | Admitting: Gastroenterology

## 2019-09-30 ENCOUNTER — Other Ambulatory Visit: Payer: Self-pay

## 2019-10-03 ENCOUNTER — Other Ambulatory Visit
Admission: RE | Admit: 2019-10-03 | Discharge: 2019-10-03 | Disposition: A | Discharge status: Home / Self Care | Payer: Medicare Other | Source: Ambulatory Visit | Attending: Gastroenterology | Admitting: Gastroenterology

## 2019-10-03 ENCOUNTER — Other Ambulatory Visit: Payer: Self-pay

## 2019-10-03 DIAGNOSIS — Z01812 Encounter for preprocedural laboratory examination: Secondary | ICD-10-CM | POA: Diagnosis present

## 2019-10-03 DIAGNOSIS — Z20822 Contact with and (suspected) exposure to covid-19: Secondary | ICD-10-CM | POA: Diagnosis not present

## 2019-10-03 LAB — SARS CORONAVIRUS 2 (TAT 6-24 HRS): SARS Coronavirus 2: NEGATIVE

## 2019-10-04 NOTE — Discharge Instructions (Signed)
General Anesthesia, Adult, Care After This sheet gives you information about how to care for yourself after your procedure. Your health care provider may also give you more specific instructions. If you have problems or questions, contact your health care provider. What can I expect after the procedure? After the procedure, the following side effects are common:  Pain or discomfort at the IV site.  Nausea.  Vomiting.  Sore throat.  Trouble concentrating.  Feeling cold or chills.  Weak or tired.  Sleepiness and fatigue.  Soreness and body aches. These side effects can affect parts of the body that were not involved in surgery. Follow these instructions at home:  For at least 24 hours after the procedure:  Have a responsible adult stay with you. It is important to have someone help care for you until you are awake and alert.  Rest as needed.  Do not: ? Participate in activities in which you could fall or become injured. ? Drive. ? Use heavy machinery. ? Drink alcohol. ? Take sleeping pills or medicines that cause drowsiness. ? Make important decisions or sign legal documents. ? Take care of children on your own. Eating and drinking  Follow any instructions from your health care provider about eating or drinking restrictions.  When you feel hungry, start by eating small amounts of foods that are soft and easy to digest (bland), such as toast. Gradually return to your regular diet.  Drink enough fluid to keep your urine pale yellow.  If you vomit, rehydrate by drinking water, juice, or clear broth. General instructions  If you have sleep apnea, surgery and certain medicines can increase your risk for breathing problems. Follow instructions from your health care provider about wearing your sleep device: ? Anytime you are sleeping, including during daytime naps. ? While taking prescription pain medicines, sleeping medicines, or medicines that make you drowsy.  Return to  your normal activities as told by your health care provider. Ask your health care provider what activities are safe for you.  Take over-the-counter and prescription medicines only as told by your health care provider.  If you smoke, do not smoke without supervision.  Keep all follow-up visits as told by your health care provider. This is important. Contact a health care provider if:  You have nausea or vomiting that does not get better with medicine.  You cannot eat or drink without vomiting.  You have pain that does not get better with medicine.  You are unable to pass urine.  You develop a skin rash.  You have a fever.  You have redness around your IV site that gets worse. Get help right away if:  You have difficulty breathing.  You have chest pain.  You have blood in your urine or stool, or you vomit blood. Summary  After the procedure, it is common to have a sore throat or nausea. It is also common to feel tired.  Have a responsible adult stay with you for the first 24 hours after general anesthesia. It is important to have someone help care for you until you are awake and alert.  When you feel hungry, start by eating small amounts of foods that are soft and easy to digest (bland), such as toast. Gradually return to your regular diet.  Drink enough fluid to keep your urine pale yellow.  Return to your normal activities as told by your health care provider. Ask your health care provider what activities are safe for you. This information is not   intended to replace advice given to you by your health care provider. Make sure you discuss any questions you have with your health care provider. Document Revised: 06/09/2017 Document Reviewed: 01/20/2017 Elsevier Patient Education  2020 [employer]  

## 2019-10-07 ENCOUNTER — Encounter
Admission: RE | Disposition: A | Discharge status: Home / Self Care | Payer: Self-pay | Source: Home / Self Care | Attending: Gastroenterology

## 2019-10-07 ENCOUNTER — Other Ambulatory Visit: Payer: Self-pay

## 2019-10-07 ENCOUNTER — Ambulatory Visit
Admission: RE | Admit: 2019-10-07 | Discharge: 2019-10-07 | Disposition: A | Discharge status: Home / Self Care | Payer: Medicare Other | Attending: Gastroenterology | Admitting: Gastroenterology

## 2019-10-07 ENCOUNTER — Ambulatory Visit: Payer: Medicare Other | Admitting: Anesthesiology

## 2019-10-07 ENCOUNTER — Encounter: Payer: Self-pay | Admitting: Gastroenterology

## 2019-10-07 DIAGNOSIS — Z79899 Other long term (current) drug therapy: Secondary | ICD-10-CM | POA: Diagnosis not present

## 2019-10-07 DIAGNOSIS — Z1211 Encounter for screening for malignant neoplasm of colon: Secondary | ICD-10-CM | POA: Diagnosis present

## 2019-10-07 DIAGNOSIS — R7303 Prediabetes: Secondary | ICD-10-CM | POA: Insufficient documentation

## 2019-10-07 DIAGNOSIS — Z91040 Latex allergy status: Secondary | ICD-10-CM | POA: Diagnosis not present

## 2019-10-07 DIAGNOSIS — K640 First degree hemorrhoids: Secondary | ICD-10-CM | POA: Diagnosis not present

## 2019-10-07 DIAGNOSIS — Z6832 Body mass index (BMI) 32.0-32.9, adult: Secondary | ICD-10-CM | POA: Insufficient documentation

## 2019-10-07 DIAGNOSIS — Z860100 Personal history of colon polyps, unspecified: Secondary | ICD-10-CM

## 2019-10-07 DIAGNOSIS — I10 Essential (primary) hypertension: Secondary | ICD-10-CM | POA: Insufficient documentation

## 2019-10-07 DIAGNOSIS — D124 Benign neoplasm of descending colon: Secondary | ICD-10-CM | POA: Diagnosis not present

## 2019-10-07 DIAGNOSIS — D123 Benign neoplasm of transverse colon: Secondary | ICD-10-CM | POA: Diagnosis not present

## 2019-10-07 DIAGNOSIS — Z87891 Personal history of nicotine dependence: Secondary | ICD-10-CM | POA: Insufficient documentation

## 2019-10-07 DIAGNOSIS — E669 Obesity, unspecified: Secondary | ICD-10-CM | POA: Insufficient documentation

## 2019-10-07 DIAGNOSIS — K635 Polyp of colon: Secondary | ICD-10-CM | POA: Diagnosis not present

## 2019-10-07 HISTORY — PX: POLYPECTOMY: SHX5525

## 2019-10-07 HISTORY — PX: COLONOSCOPY WITH PROPOFOL: SHX5780

## 2019-10-07 HISTORY — DX: Presence of dental prosthetic device (complete) (partial): Z97.2

## 2019-10-07 SURGERY — COLONOSCOPY WITH PROPOFOL
Anesthesia: General | Site: Rectum

## 2019-10-07 MED ORDER — ONDANSETRON HCL 4 MG/2ML IJ SOLN: 4.0000 mg | Freq: Once | INTRAMUSCULAR | Status: DC | PRN

## 2019-10-07 MED ORDER — STERILE WATER FOR IRRIGATION IR SOLN
Status: DC | PRN
  Administered 2019-10-07: .05 mL

## 2019-10-07 MED ORDER — ACETAMINOPHEN 325 MG PO TABS: 325.0000 mg | ORAL_TABLET | ORAL | Status: DC | PRN

## 2019-10-07 MED ORDER — ACETAMINOPHEN 160 MG/5ML PO SOLN: 325.0000 mg | ORAL | Status: DC | PRN

## 2019-10-07 MED ORDER — LACTATED RINGERS IV SOLN: INTRAVENOUS | Status: DC

## 2019-10-07 MED ORDER — LIDOCAINE HCL (CARDIAC) PF 100 MG/5ML IV SOSY
PREFILLED_SYRINGE | INTRAVENOUS | Status: DC | PRN
  Administered 2019-10-07: 30 mg via INTRAVENOUS

## 2019-10-07 MED ORDER — PROPOFOL 10 MG/ML IV BOLUS
INTRAVENOUS | Status: DC | PRN
  Administered 2019-10-07 (×2): 100 mg via INTRAVENOUS

## 2019-10-07 SURGICAL SUPPLY — 8 items
CANISTER SUCT 1200ML W/VALVE (MISCELLANEOUS) ×4 IMPLANT
FORCEPS BIOP RAD 4 LRG CAP 4 (CUTTING FORCEPS) IMPLANT
GOWN CVR UNV OPN BCK APRN NK (MISCELLANEOUS) ×4 IMPLANT
GOWN ISOL THUMB LOOP REG UNIV (MISCELLANEOUS) ×4
KIT ENDO PROCEDURE OLY (KITS) ×4 IMPLANT
SNARE SHORT THROW 13M SML OVAL (MISCELLANEOUS) ×4 IMPLANT
TRAP ETRAP POLY (MISCELLANEOUS) ×4 IMPLANT
WATER STERILE IRR 250ML POUR (IV SOLUTION) ×4 IMPLANT

## 2019-10-07 NOTE — Anesthesia Procedure Notes (Signed)
Procedure Name: General with mask airway Performed by: Kristin Barcus M, CRNA Pre-anesthesia Checklist: Patient identified, Emergency Drugs available, Suction available, Patient being monitored and Timeout performed Patient Re-evaluated:Patient Re-evaluated prior to induction Oxygen Delivery Method: Nasal cannula       

## 2019-10-07 NOTE — Op Note (Signed)
[facility] Gastroenterology Patient Name: Carol Armstrong Procedure Date: 10/07/2019 9:26 AM MRN: [id number] Account #: [id number] Date of Birth: 15-May-1941 Admit Type: Outpatient Age: 79 Room: [facility] OR ROOM 01 Gender: Female Note Status: Finalized Procedure:             Colonoscopy Indications:           High risk colon cancer surveillance: Personal history                         of colonic polyps Providers:             Lucilla Lame MD, MD Referring MD:          Volney American (Referring MD) Medicines:             Propofol per Anesthesia Complications:         No immediate complications. Procedure:             Pre-Anesthesia Assessment:                        - Prior to the procedure, a History and Physical was                         performed, and patient medications and allergies were                         reviewed. The patient's tolerance of previous                         anesthesia was also reviewed. The risks and benefits                         of the procedure and the sedation options and risks                         were discussed with the patient. All questions were                         answered, and informed consent was obtained. Prior                         Anticoagulants: The patient has taken no previous                         anticoagulant or antiplatelet agents. ASA Grade                         Assessment: II - A patient with mild systemic disease.                         After reviewing the risks and benefits, the patient                         was deemed in satisfactory condition to undergo the                         procedure.  After obtaining informed consent, the colonoscope was                         passed under direct vision. Throughout the procedure,                         the patient's blood pressure, pulse, and oxygen                         saturations were monitored continuously. The was                    introduced through the anus and advanced to the the                         cecum, identified by appendiceal orifice and ileocecal                         valve. The colonoscopy was performed without                         difficulty. The patient tolerated the procedure well.                         The quality of the bowel preparation was good. Findings:      The perianal and digital rectal examinations were normal.      A 6 mm polyp was found in the sigmoid colon. The polyp was sessile. The       polyp was removed with a cold snare. Resection and retrieval were       complete.      Three sessile polyps were found in the transverse colon. The polyps were       4 to 7 mm in size. These polyps were removed with a cold snare.       Resection and retrieval were complete.      A 3 mm polyp was found in the transverse colon. The polyp was sessile.       The polyp was removed with a cold biopsy forceps. Resection and       retrieval were complete.      A 3 mm polyp was found in the descending colon. The polyp was sessile.       The polyp was removed with a cold biopsy forceps. Resection and       retrieval were complete.      Non-bleeding internal hemorrhoids were found during retroflexion. The       hemorrhoids were Grade I (internal hemorrhoids that do not prolapse). Impression:            - One 6 mm polyp in the sigmoid colon, removed with a                         cold snare. Resected and retrieved.                        - Three 4 to 7 mm polyps in the transverse colon,                         removed with a cold snare. Resected and retrieved.                        -  One 3 mm polyp in the transverse colon, removed with                         a cold biopsy forceps. Resected and retrieved.                        - One 3 mm polyp in the descending colon, removed with                         a cold biopsy forceps. Resected and retrieved.                        -  Non-bleeding internal hemorrhoids. Recommendation:        - Discharge patient to home.                        - Resume previous diet.                        - Continue present medications.                        - Await pathology results. Procedure Code(s):     --- Professional ---                        [phone], Colonoscopy, flexible; with removal of                         tumor(s), polyp(s), or other lesion(s) by snare                         technique                        45380, 101, Colonoscopy, flexible; with biopsy, single                         or multiple Diagnosis Code(s):     --- Professional ---                        Z86.010, Personal history of colonic polyps                        K63.5, Polyp of colon CPT copyright 2019 American Medical Association. All rights reserved. The codes documented in this report are preliminary and upon coder review may  be revised to meet current compliance requirements. Lucilla Lame MD, MD 10/07/2019 9:52:48 AM This report has been signed electronically. Number of Addenda: 0 Note Initiated On: 10/07/2019 9:26 AM Scope Withdrawal Time: 0 hours 8 minutes 0 seconds  Total Procedure Duration: 0 hours 18 minutes 13 seconds  Estimated Blood Loss:  Estimated blood loss: none.      [facility]

## 2019-10-07 NOTE — Anesthesia Postprocedure Evaluation (Signed)
Anesthesia Post Note  Patient: Carol Armstrong  Procedure(s) Performed: COLONOSCOPY WITH PROPOFOL (N/A Rectum) POLYPECTOMY (Rectum)     Patient location during evaluation: PACU Anesthesia Type: General Level of consciousness: awake and alert Pain management: pain level controlled Vital Signs Assessment: post-procedure vital signs reviewed and stable Respiratory status: spontaneous breathing, nonlabored ventilation, respiratory function stable and patient connected to nasal cannula oxygen Cardiovascular status: blood pressure returned to baseline and stable Postop Assessment: no apparent nausea or vomiting Anesthetic complications: no    Adele Barthel Shaunette Gassner

## 2019-10-07 NOTE — H&P (Signed)
Lucilla Lame, MD [address] [place], [place] [address] [phone] Fax : [phone]  Primary Care Physician:  Volney American, [place] Primary Gastroenterologist:  Dr. Allen Norris  Pre-Procedure History & Physical: HPI:  Carol Armstrong is a 79 y.o. female is here for an colonoscopy.   Past Medical History:  Diagnosis Date  . Hypertension   . Prediabetes   . Wears dentures    partial upper and lower    Past Surgical History:  Procedure Laterality Date  . COLONOSCOPY    . TUBAL LIGATION      Prior to Admission medications   Medication Sig Start Date End Date Taking? Authorizing Provider  amLODipine (NORVASC) 5 MG tablet TAKE 1 TABLET(5 MG) BY MOUTH DAILY 08/01/19  Yes Volney American, PA-C  Multiple Vitamin (MULTIVITAMIN) capsule Take by mouth.   Yes [provider]    Allergies as of 08/29/2019 - Review Complete 08/29/2019  Allergen Reaction Noted  . Latex Itching and Swelling 11/26/2013    Family History  Problem Relation Age of Onset  . Hypertension Mother   . Prostate cancer Father   . Hypertension Sister   . Hypertension Brother   . Hypertension Sister   . Hypertension Sister   . Hypertension Sister   . Breast cancer Neg Hx     Social History   Socioeconomic History  . Marital status: Divorced    Spouse name: Not on file  . Number of children: Not on file  . Years of education: Not on file  . Highest education level: Not on file  Occupational History  . Not on file  Tobacco Use  . Smoking status: Former Research scientist (life sciences)  . Smokeless tobacco: Never Used  . Tobacco comment: as teenager  Substance and Sexual Activity  . Alcohol use: Yes    Comment: occasinally wine  . Drug use: Never  . Sexual activity: Not Currently  Other Topics Concern  . Not on file  Social History Narrative  . Not on file   Social Determinants of Health   Financial Resource Strain:   . Difficulty of Paying Living Expenses:   Food  Insecurity:   . Worried About Charity fundraiser in the Last Year:   . Arboriculturist in the Last Year:   Transportation Needs:   . Film/video editor (Medical):   Marland Kitchen Lack of Transportation (Non-Medical):   Physical Activity:   . Days of Exercise per Week:   . Minutes of Exercise per Session:   Stress:   . Feeling of Stress :   Social Connections:   . Frequency of Communication with Friends and Family:   . Frequency of Social Gatherings with Friends and Family:   . Attends Religious Services:   . Active Member of Clubs or Organizations:   . Attends Archivist Meetings:   Marland Kitchen Marital Status:   Intimate Partner Violence:   . Fear of Current or Ex-Partner:   . Emotionally Abused:   Marland Kitchen Physically Abused:   . Sexually Abused:     Review of Systems: See HPI, otherwise negative ROS  Physical Exam: BP 117/64   Pulse 97   Temp (!) 97.5 F (36.4 C)   Ht 5\' 6"  (1.676 m)   Wt 88 kg   SpO2 100%   BMI 31.31 kg/m  General:   Alert,  pleasant and cooperative in NAD Head:  Normocephalic and atraumatic. Neck:  Supple; no masses or thyromegaly. Lungs:  Clear  throughout to auscultation.    Heart:  Regular rate and rhythm. Abdomen:  Soft, nontender and nondistended. Normal bowel sounds, without guarding, and without rebound.   Neurologic:  Alert and  oriented x4;  grossly normal neurologically.  Impression/Plan: Carol Armstrong is here for an colonoscopy to be performed for adenomatous polyps 07/2014  Risks, benefits, limitations, and alternatives regarding  colonoscopy have been reviewed with the patient.  Questions have been answered.  All parties agreeable.   Lucilla Lame, MD  10/07/2019, 9:21 AM

## 2019-10-07 NOTE — Anesthesia Procedure Notes (Signed)
Procedure Name: General with mask airway Performed by: Farrell Broerman M, CRNA Pre-anesthesia Checklist: Patient identified, Emergency Drugs available, Suction available, Patient being monitored and Timeout performed Patient Re-evaluated:Patient Re-evaluated prior to induction Oxygen Delivery Method: Nasal cannula       

## 2019-10-07 NOTE — Anesthesia Preprocedure Evaluation (Signed)
Anesthesia Evaluation  Patient identified by MRN, date of birth, ID band Patient awake    History of Anesthesia Complications Negative for: history of anesthetic complications  Airway Mallampati: II  TM Distance: >3 FB Neck ROM: Full    Dental  (+) Partial Upper, Partial Lower   Pulmonary former smoker,    Pulmonary exam normal        Cardiovascular Exercise Tolerance: Good hypertension, Pt. on medications Normal cardiovascular exam     Neuro/Psych negative neurological ROS  negative psych ROS   GI/Hepatic negative GI ROS, Neg liver ROS,   Endo/Other  Obesity BMI 32  Renal/GU negative Renal ROS     Musculoskeletal negative musculoskeletal ROS (+)   Abdominal   Peds  Hematology negative hematology ROS (+)   Anesthesia Other Findings   Reproductive/Obstetrics                            Anesthesia Physical Anesthesia Plan  ASA: II  Anesthesia Plan: General   Post-op Pain Management:    Induction: Intravenous  PONV Risk Score and Plan: 3 and TIVA, Midazolam and Treatment may vary due to age or medical condition  Airway Management Planned: Nasal Cannula and Natural Airway  Additional Equipment: None  Intra-op Plan:   Post-operative Plan:   Informed Consent: I have reviewed the patients History and Physical, chart, labs and discussed the procedure including the risks, benefits and alternatives for the proposed anesthesia with the patient or authorized representative who has indicated his/her understanding and acceptance.       Plan Discussed with: CRNA  Anesthesia Plan Comments:         Anesthesia Quick Evaluation

## 2019-10-07 NOTE — Transfer of Care (Signed)
Immediate Anesthesia Transfer of Care Note  Patient: Carol Armstrong  Procedure(s) Performed: COLONOSCOPY WITH PROPOFOL (N/A Rectum) POLYPECTOMY (Rectum)  Patient Location: PACU  Anesthesia Type: General  Level of Consciousness: awake, alert  and patient cooperative  Airway and Oxygen Therapy: Patient Spontanous Breathing and Patient connected to supplemental oxygen  Post-op Assessment: Post-op Vital signs reviewed, Patient's Cardiovascular Status Stable, Respiratory Function Stable, Patent Airway and No signs of Nausea or vomiting  Post-op Vital Signs: Reviewed and stable  Complications: No apparent anesthesia complications

## 2019-10-08 ENCOUNTER — Encounter: Payer: Self-pay | Admitting: *Deleted

## 2019-10-08 ENCOUNTER — Encounter: Payer: Self-pay | Admitting: Gastroenterology

## 2019-10-08 LAB — SURGICAL PATHOLOGY

## 2019-11-27 ENCOUNTER — Other Ambulatory Visit: Payer: Self-pay | Admitting: Family Medicine

## 2019-11-27 DIAGNOSIS — Z1231 Encounter for screening mammogram for malignant neoplasm of breast: Secondary | ICD-10-CM

## 2019-12-26 ENCOUNTER — Other Ambulatory Visit: Payer: Self-pay

## 2019-12-26 ENCOUNTER — Ambulatory Visit
Admission: RE | Admit: 2019-12-26 | Discharge: 2019-12-26 | Disposition: A | Discharge status: Home / Self Care | Payer: Medicare Other | Source: Ambulatory Visit | Attending: Family Medicine | Admitting: Family Medicine

## 2019-12-26 DIAGNOSIS — Z1231 Encounter for screening mammogram for malignant neoplasm of breast: Secondary | ICD-10-CM | POA: Diagnosis not present

## 2020-01-27 ENCOUNTER — Telehealth: Payer: Self-pay | Admitting: Family Medicine

## 2020-01-27 NOTE — Telephone Encounter (Signed)
Copied from [place] [phone] [phone]. Topic: Medicare AWV >> Jan 27, 2020 12:34 PM Cher Nakai R wrote: Reason for CRM:   Left message for patient to call back and schedule Medicare Annual Wellness Visit (AWV) to be done virtually.  No hx of AWV  Please schedule at anytime with [facility] Health Advisor.      79 Minutes appointment   Any questions, please call me at [phone]

## 2020-01-30 LAB — HM DIABETES EYE EXAM

## 2020-02-10 ENCOUNTER — Ambulatory Visit (INDEPENDENT_AMBULATORY_CARE_PROVIDER_SITE_OTHER): Payer: Medicare Other

## 2020-02-10 VITALS — Ht 67.5 in | Wt 190.0 lb

## 2020-02-10 DIAGNOSIS — Z0000 Encounter for general adult medical examination without abnormal findings: Secondary | ICD-10-CM | POA: Diagnosis not present

## 2020-02-10 NOTE — Patient Instructions (Signed)
Carol Armstrong , Thank you for taking time to come for your Medicare Wellness Visit. I appreciate your ongoing commitment to your health goals. Please review the following plan we discussed and let me know if I can assist you in the future.   Screening recommendations/referrals: Colonoscopy: not required Mammogram: completed 12/26/2019 Bone Density: completed 08/18/2008 Recommended yearly ophthalmology/optometry visit for glaucoma screening and checkup Recommended yearly dental visit for hygiene and checkup  Vaccinations: Influenza vaccine: due Pneumococcal vaccine: completed 08/29/2019 Tdap vaccine: due Shingles vaccine: discussed   Covid-19:10/21/2019, 11/11/2019  Advanced directives: Advance directive discussed with you today.   Conditions/risks identified: none  Next appointment: Follow up in one year for your annual wellness visit    Preventive Care 65 Years and Older, Female Preventive care refers to lifestyle choices and visits with your health care provider that can promote health and wellness. What does preventive care include?  A yearly physical exam. This is also called an annual well check.  Dental exams once or twice a year.  Routine eye exams. Ask your health care provider how often you should have your eyes checked.  Personal lifestyle choices, including:  Daily care of your teeth and gums.  Regular physical activity.  Eating a healthy diet.  Avoiding tobacco and drug use.  Limiting alcohol use.  Practicing safe sex.  Taking low-dose aspirin every day.  Taking vitamin and mineral supplements as recommended by your health care provider. What happens during an annual well check? The services and screenings done by your health care provider during your annual well check will depend on your age, overall health, lifestyle risk factors, and family history of disease. Counseling  Your health care provider may ask you questions about your:  Alcohol use.  Tobacco  use.  Drug use.  Emotional well-being.  Home and relationship well-being.  Sexual activity.  Eating habits.  History of falls.  Memory and ability to understand (cognition).  Work and work Statistician.  Reproductive health. Screening  You may have the following tests or measurements:  Height, weight, and BMI.  Blood pressure.  Lipid and cholesterol levels. These may be checked every 5 years, or more frequently if you are over 4 years old.  Skin check.  Lung cancer screening. You may have this screening every year starting at age 77 if you have a 30-pack-year history of smoking and currently smoke or have quit within the past 15 years.  Fecal occult blood test (FOBT) of the stool. You may have this test every year starting at age 67.  Flexible sigmoidoscopy or colonoscopy. You may have a sigmoidoscopy every 5 years or a colonoscopy every 10 years starting at age 52.  Hepatitis C blood test.  Hepatitis B blood test.  Sexually transmitted disease (STD) testing.  Diabetes screening. This is done by checking your blood sugar (glucose) after you have not eaten for a while (fasting). You may have this done every 1-3 years.  Bone density scan. This is done to screen for osteoporosis. You may have this done starting at age 99.  Mammogram. This may be done every 1-2 years. Talk to your health care provider about how often you should have regular mammograms. Talk with your health care provider about your test results, treatment options, and if necessary, the need for more tests. Vaccines  Your health care provider may recommend certain vaccines, such as:  Influenza vaccine. This is recommended every year.  Tetanus, diphtheria, and acellular pertussis (Tdap, Td) vaccine. You may need a  Td booster every 10 years.  Zoster vaccine. You may need this after age 41.  Pneumococcal 13-valent conjugate (PCV13) vaccine. One dose is recommended after age 78.  Pneumococcal  polysaccharide (PPSV23) vaccine. One dose is recommended after age 81. Talk to your health care provider about which screenings and vaccines you need and how often you need them. This information is not intended to replace advice given to you by your health care provider. Make sure you discuss any questions you have with your health care provider. Document Released: 07/03/2015 Document Revised: 02/24/2016 Document Reviewed: 04/07/2015 Elsevier Interactive Patient Education  2017 [place] Prevention in the Home Falls can cause injuries. They can happen to people of all ages. There are many things you can do to make your home safe and to help prevent falls. What can I do on the outside of my home?  Regularly fix the edges of walkways and driveways and fix any cracks.  Remove anything that might make you trip as you walk through a door, such as a raised step or threshold.  Trim any bushes or trees on the path to your home.  Use bright outdoor lighting.  Clear any walking paths of anything that might make someone trip, such as rocks or tools.  Regularly check to see if handrails are loose or broken. Make sure that both sides of any steps have handrails.  Any raised decks and porches should have guardrails on the edges.  Have any leaves, snow, or ice cleared regularly.  Use sand or salt on walking paths during winter.  Clean up any spills in your garage right away. This includes oil or grease spills. What can I do in the bathroom?  Use night lights.  Install grab bars by the toilet and in the tub and shower. Do not use towel bars as grab bars.  Use non-skid mats or decals in the tub or shower.  If you need to sit down in the shower, use a plastic, non-slip stool.  Keep the floor dry. Clean up any water that spills on the floor as soon as it happens.  Remove soap buildup in the tub or shower regularly.  Attach bath mats securely with double-sided non-slip rug  tape.  Do not have throw rugs and other things on the floor that can make you trip. What can I do in the bedroom?  Use night lights.  Make sure that you have a light by your bed that is easy to reach.  Do not use any sheets or blankets that are too big for your bed. They should not hang down onto the floor.  Have a firm chair that has side arms. You can use this for support while you get dressed.  Do not have throw rugs and other things on the floor that can make you trip. What can I do in the kitchen?  Clean up any spills right away.  Avoid walking on wet floors.  Keep items that you use a lot in easy-to-reach places.  If you need to reach something above you, use a strong step stool that has a grab bar.  Keep electrical cords out of the way.  Do not use floor polish or wax that makes floors slippery. If you must use wax, use non-skid floor wax.  Do not have throw rugs and other things on the floor that can make you trip. What can I do with my stairs?  Do not leave any items on the stairs.  Make sure that there are handrails on both sides of the stairs and use them. Fix handrails that are broken or loose. Make sure that handrails are as long as the stairways.  Check any carpeting to make sure that it is firmly attached to the stairs. Fix any carpet that is loose or worn.  Avoid having throw rugs at the top or bottom of the stairs. If you do have throw rugs, attach them to the floor with carpet tape.  Make sure that you have a light switch at the top of the stairs and the bottom of the stairs. If you do not have them, ask someone to add them for you. What else can I do to help prevent falls?  Wear shoes that:  Do not have high heels.  Have rubber bottoms.  Are comfortable and fit you well.  Are closed at the toe. Do not wear sandals.  If you use a stepladder:  Make sure that it is fully opened. Do not climb a closed stepladder.  Make sure that both sides of the  stepladder are locked into place.  Ask someone to hold it for you, if possible.  Clearly mark and make sure that you can see:  Any grab bars or handrails.  First and last steps.  Where the edge of each step is.  Use tools that help you move around (mobility aids) if they are needed. These include:  Canes.  Walkers.  Scooters.  Crutches.  Turn on the lights when you go into a dark area. Replace any light bulbs as soon as they burn out.  Set up your furniture so you have a clear path. Avoid moving your furniture around.  If any of your floors are uneven, fix them.  If there are any pets around you, be aware of where they are.  Review your medicines with your doctor. Some medicines can make you feel dizzy. This can increase your chance of falling. Ask your doctor what other things that you can do to help prevent falls. This information is not intended to replace advice given to you by your health care provider. Make sure you discuss any questions you have with your health care provider. Document Released: 04/02/2009 Document Revised: 11/12/2015 Document Reviewed: 07/11/2014 Elsevier Interactive Patient Education  2017 [employer].

## 2020-02-10 NOTE — Progress Notes (Signed)
I connected with Gweneth Fritter today by telephone and verified that I am speaking with the correct person using two identifiers. Location patient: home Location provider: work Persons participating in the virtual visit: Gweneth Fritter, Glenna Durand LPN.   I discussed the limitations, risks, security and privacy concerns of performing an evaluation and management service by telephone and the availability of in person appointments. I also discussed with the patient that there may be a patient responsible charge related to this service. The patient expressed understanding and verbally consented to this telephonic visit.    Interactive audio and video telecommunications were attempted between this provider and patient, however failed, due to patient having technical difficulties OR patient did not have access to video capability.  We continued and completed visit with audio only.    Vital signs may be patient reported or missing.   Subjective:   ZIOMARA BIRENBAUM is a 79 y.o. female who presents for an Initial Medicare Annual Wellness Visit.  Review of Systems     Cardiac Risk Factors include: advanced age (>77men, >36 women);hypertension;sedentary lifestyle     Objective:    Today's Vitals   02/10/20 1111  Weight: 190 lb (86.2 kg)  Height: 5' 7.5" (1.715 m)   Body mass index is 29.32 kg/m.  Advanced Directives 02/10/2020 10/07/2019  Does Patient Have a Medical Advance Directive? No No  Would patient like information on creating a medical advance directive? - No - Patient declined    Current Medications (verified) Outpatient Encounter Medications as of 02/10/2020  Medication Sig  . Multiple Vitamin (MULTIVITAMIN) capsule Take by mouth.  Marland Kitchen amLODipine (NORVASC) 5 MG tablet TAKE 1 TABLET(5 MG) BY MOUTH DAILY (Patient not taking: Reported on 02/10/2020)   No facility-administered encounter medications on file as of 02/10/2020.    Allergies (verified) Latex   History: Past Medical  History:  Diagnosis Date  . Hypertension   . Prediabetes   . Wears dentures    partial upper and lower   Past Surgical History:  Procedure Laterality Date  . COLONOSCOPY    . COLONOSCOPY WITH PROPOFOL N/A 10/07/2019   Procedure: COLONOSCOPY WITH PROPOFOL;  Surgeon: Lucilla Lame, MD;  Location: [facility];  Service: Endoscopy;  Laterality: N/A;  Latex  priority 4  . POLYPECTOMY  10/07/2019   Procedure: POLYPECTOMY;  Surgeon: Lucilla Lame, MD;  Location: [facility];  Service: Endoscopy;;  . TUBAL LIGATION     Family History  Problem Relation Age of Onset  . Hypertension Mother   . Prostate cancer Father   . Hypertension Sister   . Hypertension Brother   . Hypertension Sister   . Hypertension Sister   . Hypertension Sister   . Breast cancer Neg Hx    Social History   Socioeconomic History  . Marital status: Divorced    Spouse name: Not on file  . Number of children: Not on file  . Years of education: Not on file  . Highest education level: Not on file  Occupational History  . Occupation: retired  Tobacco Use  . Smoking status: Former Research scientist (life sciences)  . Smokeless tobacco: Never Used  . Tobacco comment: as teenager  Vaping Use  . Vaping Use: Never used  Substance and Sexual Activity  . Alcohol use: Yes    Comment: occasinally wine  . Drug use: Never  . Sexual activity: Not Currently  Other Topics Concern  . Not on file  Social History Narrative  . Not on file   Social  Determinants of Health   Financial Resource Strain: Low Risk   . Difficulty of Paying Living Expenses: Not hard at all  Food Insecurity: No Food Insecurity  . Worried About Charity fundraiser in the Last Year: Never true  . Ran Out of Food in the Last Year: Never true  Transportation Needs: No Transportation Needs  . Lack of Transportation (Medical): No  . Lack of Transportation (Non-Medical): No  Physical Activity: Inactive  . Days of Exercise per Week: 0 days  . Minutes of Exercise  per Session: 0 min  Stress: No Stress Concern Present  . Feeling of Stress : Not at all  Social Connections:   . Frequency of Communication with Friends and Family: Not on file  . Frequency of Social Gatherings with Friends and Family: Not on file  . Attends Religious Services: Not on file  . Active Member of Clubs or Organizations: Not on file  . Attends Archivist Meetings: Not on file  . Marital Status: Not on file    Tobacco Counseling Counseling given: Not Answered Comment: as teenager   Clinical Intake:  Pre-visit preparation completed: Yes  Pain : No/denies pain     Nutritional Status: BMI 25 -29 Overweight Nutritional Risks: None Diabetes: No  How often do you need to have someone help you when you read instructions, pamphlets, or other written materials from your doctor or pharmacy?: 1 - Never What is the last grade level you completed in school?: administration degree  Diabetic? no  Interpreter Needed?: No  Information entered by :: NAllen LPN   Activities of Daily Living In your present state of health, do you have any difficulty performing the following activities: 02/10/2020 10/07/2019  Hearing? N N  Vision? N N  Difficulty concentrating or making decisions? N N  Walking or climbing stairs? N N  Dressing or bathing? N N  Doing errands, shopping? N -  Preparing Food and eating ? N -  Using the Toilet? N -  In the past six months, have you accidently leaked urine? N -  Do you have problems with loss of bowel control? N -  Managing your Medications? N -  Managing your Finances? N -  Housekeeping or managing your Housekeeping? N -  Some recent data might be hidden    Patient Care Team: Volney American, PA-C as PCP - General (Family Medicine)  Indicate any recent Medical Services you may have received from other than [facility] providers in the past year (date may be approximate).     Assessment:   This is a routine wellness examination  for [facility].  Hearing/Vision screen  Hearing Screening   125Hz  250Hz  500Hz  1000Hz  2000Hz  3000Hz  4000Hz  6000Hz  8000Hz   Right ear:           Left ear:           Vision Screening Comments: Regular eye exams, Dr. Matilde Sprang  Dietary issues and exercise activities discussed: Current Exercise Habits: The patient does not participate in regular exercise at present  Goals    . Patient Stated     02/10/2020, wants to lose 15 pounds      Depression Screen PHQ 2/9 Scores 02/10/2020 08/29/2019 08/01/2019  PHQ - 2 Score 0 0 0  PHQ- 9 Score - 0 0    Fall Risk Fall Risk  02/10/2020 08/29/2019 08/01/2019  Falls in the past year? 0 0 0  Number falls in past yr: - 0 0  Injury with Fall? -  0 0  Risk for fall due to : No Fall Risks - -  Follow up Falls evaluation completed;Education provided;Falls prevention discussed - -    Any stairs in or around the home? Yes  If so, are there any without handrails? No  Home free of loose throw rugs in walkways, pet beds, electrical cords, etc? Yes  Adequate lighting in your home to reduce risk of falls? Yes   ASSISTIVE DEVICES UTILIZED TO PREVENT FALLS:  Life alert? Yes  Use of a cane, walker or w/c? No  Grab bars in the bathroom? Yes  Shower chair or bench in shower? No  Elevated toilet seat or a handicapped toilet? No   TIMED UP AND GO:  Was the test performed? No . .     Cognitive Function:     6CIT Screen 02/10/2020  What Year? 0 points  What month? 0 points  What time? 0 points  Count back from 20 4 points  Months in reverse 2 points  Repeat phrase 4 points  Total Score 10    Immunizations Immunization History  Administered Date(s) Administered  . Influenza, High Dose Seasonal PF 04/09/2018  . Influenza-Unspecified 06/11/2015, 05/07/2016, 06/04/2019  . PFIZER SARS-COV-2 Vaccination 10/21/2019, 11/11/2019  . Pneumococcal Conjugate-13 12/12/2015  . Pneumococcal Polysaccharide-23 08/29/2019    TDAP status: Due, Education has been provided  regarding the importance of this vaccine. Advised may receive this vaccine at local pharmacy or Health Dept. Aware to provide a copy of the vaccination record if obtained from local pharmacy or Health Dept. Verbalized acceptance and understanding. Flu Vaccine status: Up to date Pneumococcal vaccine status: Up to date Covid-19 vaccine status: Completed vaccines  Qualifies for Shingles Vaccine? Yes   Zostavax completed No   Shingrix Completed?: No.    Education has been provided regarding the importance of this vaccine. Patient has been advised to call insurance company to determine out of pocket expense if they have not yet received this vaccine. Advised may also receive vaccine at local pharmacy or Health Dept. Verbalized acceptance and understanding.  Screening Tests Health Maintenance  Topic Date Due  . Hepatitis C Screening  Never done  . TETANUS/TDAP  Never done  . INFLUENZA VACCINE  01/19/2020  . URINE MICROALBUMIN  08/28/2020  . MAMMOGRAM  12/25/2020  . DEXA SCAN  Completed  . COVID-19 Vaccine  Completed  . PNA vac Low Risk Adult  Completed    Health Maintenance  Health Maintenance Due  Topic Date Due  . Hepatitis C Screening  Never done  . TETANUS/TDAP  Never done  . INFLUENZA VACCINE  01/19/2020    Colorectal cancer screening: No longer required.  Mammogram status: Completed 12/26/2019. Repeat every year Bone Density status: Completed 08/18/2008.   Lung Cancer Screening: (Low Dose CT Chest recommended if Age 46-80 years, 30 pack-year currently smoking OR have quit w/in 15years.) does not qualify.   Lung Cancer Screening Referral: no   Additional Screening:  Hepatitis C Screening: does qualify;   Vision Screening: Recommended annual ophthalmology exams for early detection of glaucoma and other disorders of the eye. Is the patient up to date with their annual eye exam?  Yes  Who is the provider or what is the name of the office in which the patient attends annual eye  exams? Dr. Matilde Sprang If pt is not established with a provider, would they like to be referred to a provider to establish care? No .   Dental Screening: Recommended annual dental exams for  proper oral hygiene  Community Resource Referral / Chronic Care Management: CRR required this visit?  No   CCM required this visit?  No      Plan:     I have personally reviewed and noted the following in the patient's chart:   . Medical and social history . Use of alcohol, tobacco or illicit drugs  . Current medications and supplements . Functional ability and status . Nutritional status . Physical activity . Advanced directives . List of other physicians . Hospitalizations, surgeries, and ER visits in previous 12 months . Vitals . Screenings to include cognitive, depression, and falls . Referrals and appointments  In addition, I have reviewed and discussed with patient certain preventive protocols, quality metrics, and best practice recommendations. A written personalized care plan for preventive services as well as general preventive health recommendations were provided to patient.     Kellie Simmering, LPN   [id number]   Nurse Notes:

## 2020-03-05 ENCOUNTER — Ambulatory Visit (INDEPENDENT_AMBULATORY_CARE_PROVIDER_SITE_OTHER): Payer: Medicare Other | Admitting: Unknown Physician Specialty

## 2020-03-05 ENCOUNTER — Ambulatory Visit: Payer: Medicare Other | Admitting: Family Medicine

## 2020-03-05 ENCOUNTER — Other Ambulatory Visit: Payer: Self-pay

## 2020-03-05 ENCOUNTER — Encounter: Payer: Self-pay | Admitting: Unknown Physician Specialty

## 2020-03-05 VITALS — BP 153/89 | HR 94 | Temp 98.4°F | Wt 199.0 lb

## 2020-03-05 DIAGNOSIS — R7301 Impaired fasting glucose: Secondary | ICD-10-CM

## 2020-03-05 DIAGNOSIS — Z23 Encounter for immunization: Secondary | ICD-10-CM

## 2020-03-05 DIAGNOSIS — E785 Hyperlipidemia, unspecified: Secondary | ICD-10-CM | POA: Diagnosis not present

## 2020-03-05 DIAGNOSIS — I10 Essential (primary) hypertension: Secondary | ICD-10-CM | POA: Diagnosis not present

## 2020-03-05 DIAGNOSIS — E7800 Pure hypercholesterolemia, unspecified: Secondary | ICD-10-CM | POA: Insufficient documentation

## 2020-03-05 MED ORDER — AMLODIPINE BESYLATE 5 MG PO TABS: 5.0000 mg | ORAL_TABLET | Freq: Every day | ORAL | 1 refills | Status: DC

## 2020-03-05 NOTE — Assessment & Plan Note (Signed)
Check Hgb A1C today

## 2020-03-05 NOTE — Assessment & Plan Note (Signed)
See ASCVD calculator >40%  Shared decision making with patient about stain.  She wants to think about it

## 2020-03-05 NOTE — Progress Notes (Signed)
BP (!) 153/89   Pulse 94   Temp 98.4 F (36.9 C) (Oral)   Wt 199 lb (90.3 kg)   SpO2 97%   BMI 30.71 kg/m    Subjective:    Patient ID: Carol Armstrong, female    DOB: March 21, 1941, 79 y.o.   MRN: [id number]  HPI: Carol Armstrong is a 79 y.o. female  Chief Complaint  Patient presents with  . Hypertension    pt states has been out of the amlodipine for over 3 months run out of refills  . IFG   Hypertension Average home BPs Not checking   No problems or lightheadedness No chest pain with exertion or shortness of breath No Edema   Hyperlipidemia Risk calculator is below.  Discussed with pt and unsure if she wants to start another medication.    The 10-year ASCVD risk score Mikey Bussing DC Brooke Bonito., et al., 2013) is: 42.5%   Values used to calculate the score:     Age: 36 years     Sex: Female     Is Non-Hispanic African American: Yes     Diabetic: Yes     Tobacco smoker: No     Systolic Blood Pressure: 076 mmHg     Is BP treated: Yes     HDL Cholesterol: 60 mg/dL     Total Cholesterol: 188 mg/dL  IFG Hgb A1C 6 last visit.  No lifestyle changes.  Check Hgb A1C today   Relevant past medical, surgical, family and social history reviewed and updated as indicated. Interim medical history since our last visit reviewed. Allergies and medications reviewed and updated.  Review of Systems  Per HPI unless specifically indicated above     Objective:    BP (!) 153/89   Pulse 94   Temp 98.4 F (36.9 C) (Oral)   Wt 199 lb (90.3 kg)   SpO2 97%   BMI 30.71 kg/m   Wt Readings from Last 3 Encounters:  03/05/20 199 lb (90.3 kg)  02/10/20 190 lb (86.2 kg)  10/07/19 194 lb (88 kg)    Physical Exam Constitutional:      General: She is not in acute distress.    Appearance: Normal appearance. She is well-developed.  HENT:     Head: Normocephalic and atraumatic.  Eyes:     General: Lids are normal. No scleral icterus.       Right eye: No discharge.        Left eye: No discharge.      Conjunctiva/sclera: Conjunctivae normal.  Neck:     Vascular: No carotid bruit or JVD.  Cardiovascular:     Rate and Rhythm: Normal rate and regular rhythm.     Heart sounds: Normal heart sounds.  Pulmonary:     Effort: Pulmonary effort is normal.     Breath sounds: Normal breath sounds.  Abdominal:     Palpations: There is no hepatomegaly or splenomegaly.  Musculoskeletal:        General: Normal range of motion.     Cervical back: Normal range of motion and neck supple.  Skin:    General: Skin is warm and dry.     Coloration: Skin is not pale.     Findings: No rash.  Neurological:     Mental Status: She is alert and oriented to person, place, and time.  Psychiatric:        Behavior: Behavior normal.        Thought Content: Thought content normal.  Judgment: Judgment normal.     Results for orders placed or performed in visit on 03/03/20  HM DIABETES EYE EXAM  Result Value Ref Range   HM Diabetic Eye Exam No Retinopathy No Retinopathy      Assessment & Plan:   Problem List Items Addressed This Visit      Unprioritized   Essential hypertension    Restart Amlodipine 5 mg.  Recheck 1 months      Relevant Medications   amLODipine (NORVASC) 5 MG tablet   Other Relevant Orders   Comprehensive metabolic panel   Lipid Panel w/o Chol/HDL Ratio   Hgb A1c w/o eAG   Hyperlipidemia    See ASCVD calculator >40%  Shared decision making with patient about stain.  She wants to think about it      Relevant Medications   amLODipine (NORVASC) 5 MG tablet   IFG (impaired fasting glucose)    Check Hgb A1C today      Relevant Orders   Comprehensive metabolic panel   Lipid Panel w/o Chol/HDL Ratio   Hgb A1c w/o eAG    Other Visit Diagnoses    Flu vaccine need    -  Primary   Relevant Orders   Flu Vaccine QUAD High Dose(Fluad) (Completed)   Comprehensive metabolic panel   Lipid Panel w/o Chol/HDL Ratio   Hgb A1c w/o eAG       Follow up plan: Return in about 4  weeks (around 04/02/2020).

## 2020-03-05 NOTE — Assessment & Plan Note (Signed)
Restart Amlodipine 5 mg.  Recheck 1 months

## 2020-03-06 LAB — COMPREHENSIVE METABOLIC PANEL
ALT: 21 IU/L (ref 0–32)
AST: 23 IU/L (ref 0–40)
Albumin/Globulin Ratio: 1.6 (ref 1.2–2.2)
Albumin: 4.1 g/dL (ref 3.7–4.7)
Alkaline Phosphatase: 64 IU/L (ref 44–121)
BUN/Creatinine Ratio: 15 (ref 12–28)
BUN: 12 mg/dL (ref 8–27)
Bilirubin Total: 0.6 mg/dL (ref 0.0–1.2)
CO2: 26 mmol/L (ref 20–29)
Calcium: 9.3 mg/dL (ref 8.7–10.3)
Chloride: 104 mmol/L (ref 96–106)
Creatinine, Ser: 0.78 mg/dL (ref 0.57–1.00)
GFR calc Af Amer: 85 mL/min/{1.73_m2} (ref >59)
GFR calc non Af Amer: 74 mL/min/{1.73_m2} (ref >59)
Globulin, Total: 2.5 g/dL (ref 1.5–4.5)
Glucose: 102 mg/dL — ABNORMAL HIGH (ref 65–99)
Potassium: 4 mmol/L (ref 3.5–5.2)
Sodium: 141 mmol/L (ref 134–144)
Total Protein: 6.6 g/dL (ref 6.0–8.5)

## 2020-03-06 LAB — LIPID PANEL W/O CHOL/HDL RATIO
Cholesterol, Total: 204 mg/dL — ABNORMAL HIGH (ref 100–199)
HDL: 56 mg/dL (ref >39)
LDL Chol Calc (NIH): 129 mg/dL — ABNORMAL HIGH (ref 0–99)
Triglycerides: 109 mg/dL (ref 0–149)
VLDL Cholesterol Cal: 19 mg/dL (ref 5–40)

## 2020-03-06 LAB — HGB A1C W/O EAG: Hgb A1c MFr Bld: 6.2 % — ABNORMAL HIGH (ref 4.8–5.6)

## 2020-08-28 ENCOUNTER — Other Ambulatory Visit: Payer: Self-pay | Admitting: Unknown Physician Specialty

## 2020-08-28 ENCOUNTER — Other Ambulatory Visit: Payer: Self-pay | Admitting: Nurse Practitioner

## 2020-08-28 NOTE — Telephone Encounter (Signed)
   Notes to clinic: Patient requesting 90 day    Requested Prescriptions  Pending Prescriptions Disp Refills   amLODipine (NORVASC) 5 MG tablet [Pharmacy Med Name: AMLODIPINE BESYLATE 5MG  TABLETS] 90 tablet     Sig: TAKE 1 TABLET(5 MG) BY MOUTH DAILY      Cardiovascular:  Calcium Channel Blockers Failed - 08/28/2020 10:34 AM      Failed - Last BP in normal range    BP Readings from Last 1 Encounters:  03/05/20 (!) 153/89          Passed - Valid encounter within last 6 months    Recent Outpatient Visits           5 months ago Flu vaccine need   [facility] Kathrine Haddock, NP   1 year ago Essential hypertension   [facility] Volney American, [place]   1 year ago Essential hypertension   [facility] Volney American, [place]       Future Appointments             In 5 months [facility], [place]

## 2020-08-28 NOTE — Telephone Encounter (Signed)
Routing to provider  

## 2020-08-28 NOTE — Telephone Encounter (Signed)
Requested medications are due for refill today yes  Requested medications are on the active medication list yes  Last refill 12/13  Last visit 02/2020  Future visit scheduled 01/2021  Notes to clinic Failed protocol due to no valid visit within 6  months, last OV note stated return in 4 weeks, there is a scheduled appt in Aug..

## 2020-08-28 NOTE — Telephone Encounter (Signed)
F.UON 09/16/2020

## 2020-08-28 NOTE — Telephone Encounter (Signed)
Please get patient scheduled.  °

## 2020-08-28 NOTE — Telephone Encounter (Signed)
30 day courtesy supply sent for patient to make appointment.

## 2020-09-16 ENCOUNTER — Ambulatory Visit: Payer: Medicare Other | Admitting: Nurse Practitioner

## 2020-09-17 ENCOUNTER — Encounter: Payer: Self-pay | Admitting: Nurse Practitioner

## 2020-09-17 ENCOUNTER — Other Ambulatory Visit: Payer: Self-pay

## 2020-09-17 ENCOUNTER — Ambulatory Visit (INDEPENDENT_AMBULATORY_CARE_PROVIDER_SITE_OTHER): Payer: Medicare Other | Admitting: Nurse Practitioner

## 2020-09-17 VITALS — BP 145/84 | HR 89 | Temp 98.9°F | Wt 180.0 lb

## 2020-09-17 DIAGNOSIS — R6889 Other general symptoms and signs: Secondary | ICD-10-CM | POA: Insufficient documentation

## 2020-09-17 DIAGNOSIS — R7301 Impaired fasting glucose: Secondary | ICD-10-CM

## 2020-09-17 DIAGNOSIS — E785 Hyperlipidemia, unspecified: Secondary | ICD-10-CM | POA: Diagnosis not present

## 2020-09-17 DIAGNOSIS — I10 Essential (primary) hypertension: Secondary | ICD-10-CM | POA: Diagnosis not present

## 2020-09-17 MED ORDER — ATORVASTATIN CALCIUM 20 MG PO TABS: 20.0000 mg | ORAL_TABLET | Freq: Every day | ORAL | 2 refills | Status: DC

## 2020-09-17 MED ORDER — AMLODIPINE BESYLATE 5 MG PO TABS: ORAL_TABLET | ORAL | 1 refills | Status: DC

## 2020-09-17 NOTE — Assessment & Plan Note (Signed)
Per family patient has been more forgetful recently and has locked herself out of her house twice. MMSE done and scored 23/30. Will continue to watch for now. Encouraged mind exercises like talking with family and friends, working on puzzles. If symptoms worsen or have concerns, call the office.

## 2020-09-17 NOTE — Progress Notes (Signed)
Established Patient Office Visit  Subjective:  Patient ID: Carol Armstrong, female    DOB: March 11, 1941  Age: 80 y.o. MRN: [id number]  CC:  Chief Complaint  Patient presents with  . Hypertension    HPI Carol Armstrong presents for follow-up on hypertension and hyperlipidemia. Family also notices that patient is more forgetful recently.   HYPERTENSION / HYPERLIPIDEMIA   Satisfied with current treatment? yes Duration of hypertension: chronic BP monitoring frequency: not checking BP range: n/a BP medication side effects: no Past BP meds: amlodipine Duration of hyperlipidemia: chronic Cholesterol medication side effects: n/a Cholesterol supplements: none Past cholesterol medications: none Medication compliance: excellent compliance Aspirin: no Recent stressors: no Recurrent headaches: no Visual changes: no Palpitations: no Dyspnea: no Chest pain: no Lower extremity edema: no Dizzy/lightheaded: no  Past Medical History:  Diagnosis Date  . Hypertension   . Prediabetes   . Wears dentures    partial upper and lower    Past Surgical History:  Procedure Laterality Date  . COLONOSCOPY    . COLONOSCOPY WITH PROPOFOL N/A 10/07/2019   Procedure: COLONOSCOPY WITH PROPOFOL;  Surgeon: Lucilla Lame, MD;  Location: [place];  Service: Endoscopy;  Laterality: N/A;  Latex  priority 4  . POLYPECTOMY  10/07/2019   Procedure: POLYPECTOMY;  Surgeon: Lucilla Lame, MD;  Location: [place];  Service: Endoscopy;;  . TUBAL LIGATION      Family History  Problem Relation Age of Onset  . Hypertension Mother   . Prostate cancer Father   . Hypertension Sister   . Hypertension Brother   . Hypertension Sister   . Hypertension Sister   . Hypertension Sister   . Breast cancer Neg Hx     Social History   Socioeconomic History  . Marital status: Divorced    Spouse name: Not on file  . Number of children: Not on file  . Years of education: Not on file  . Highest  education level: Not on file  Occupational History  . Occupation: retired  Tobacco Use  . Smoking status: Former Research scientist (life sciences)  . Smokeless tobacco: Never Used  . Tobacco comment: as teenager  Vaping Use  . Vaping Use: Never used  Substance and Sexual Activity  . Alcohol use: Yes    Comment: occasinally wine  . Drug use: Never  . Sexual activity: Not Currently  Other Topics Concern  . Not on file  Social History Narrative  . Not on file   Social Determinants of Health   Financial Resource Strain: Low Risk   . Difficulty of Paying Living Expenses: Not hard at all  Food Insecurity: No Food Insecurity  . Worried About Charity fundraiser in the Last Year: Never true  . Ran Out of Food in the Last Year: Never true  Transportation Needs: No Transportation Needs  . Lack of Transportation (Medical): No  . Lack of Transportation (Non-Medical): No  Physical Activity: Inactive  . Days of Exercise per Week: 0 days  . Minutes of Exercise per Session: 0 min  Stress: No Stress Concern Present  . Feeling of Stress : Not at all  Social Connections: Not on file  Intimate Partner Violence: Not on file    Outpatient Medications Prior to Visit  Medication Sig Dispense Refill  . Multiple Vitamin (MULTIVITAMIN) capsule Take by mouth.    Marland Kitchen amLODipine (NORVASC) 5 MG tablet TAKE 1 TABLET(5 MG) BY MOUTH DAILY 30 tablet 0   No facility-administered medications prior to visit.  Allergies  Allergen Reactions  . Latex Itching and Swelling    When gloves are WORN    ROS Review of Systems  Constitutional: Negative for appetite change and fatigue.  HENT: Negative.   Eyes: Negative.   Respiratory: Negative.   Cardiovascular: Negative.   Gastrointestinal: Negative.   Endocrine: Negative.   Genitourinary: Negative.   Musculoskeletal: Negative.   Skin: Negative.   Neurological: Negative.   Psychiatric/Behavioral: Negative.       Objective:    Physical Exam Vitals and nursing note  reviewed.  Constitutional:      General: She is not in acute distress.    Appearance: Normal appearance.  HENT:     Head: Normocephalic and atraumatic.  Eyes:     Conjunctiva/sclera: Conjunctivae normal.  Neck:     Vascular: No carotid bruit.  Cardiovascular:     Rate and Rhythm: Normal rate and regular rhythm.     Pulses: Normal pulses.     Heart sounds: Normal heart sounds.  Pulmonary:     Effort: Pulmonary effort is normal.     Breath sounds: Normal breath sounds.  Abdominal:     Palpations: Abdomen is soft.     Tenderness: There is no abdominal tenderness.  Musculoskeletal:        General: Normal range of motion.     Cervical back: Normal range of motion.     Right lower leg: No edema.     Left lower leg: No edema.  Skin:    General: Skin is warm and dry.  Neurological:     Mental Status: She is alert and oriented to person, place, and time.     Comments: Remembered 2/3 words on word recall test  Psychiatric:        Mood and Affect: Mood normal.        Behavior: Behavior normal.    MMSE - Mini Mental State Exam 09/17/2020  Orientation to time 5  Orientation to Place 5  Registration 3  Attention/ Calculation 0  Recall 2  Language- name 2 objects 2  Language- repeat 1  Language- follow 3 step command 3  Language- read & follow direction 1  Write a sentence 1  Copy design 0  Total score 23     BP (!) 145/84   Pulse 89   Temp 98.9 F (37.2 C)   Wt 180 lb (81.6 kg) Comment: Patient reported  SpO2 97%   BMI 27.78 kg/m  Wt Readings from Last 3 Encounters:  09/17/20 180 lb (81.6 kg)  03/05/20 199 lb (90.3 kg)  02/10/20 190 lb (86.2 kg)   The 10-year ASCVD risk score Mikey Bussing DC Jr., et al., 2013) is: 41.5%   Values used to calculate the score:     Age: 84 years     Sex: Female     Is Non-Hispanic African American: Yes     Diabetic: Yes     Tobacco smoker: No     Systolic Blood Pressure: 510 mmHg     Is BP treated: Yes     HDL Cholesterol: 56 mg/dL      Total Cholesterol: 204 mg/dL   Health Maintenance Due  Topic Date Due  . TETANUS/TDAP  Never done  . COVID-19 Vaccine (3 - Booster for Pfizer series) 05/13/2020    There are no preventive care reminders to display for this patient.  Lab Results  Component Value Date   TSH 1.910 08/29/2019   Lab Results  Component Value Date  WBC 5.9 08/29/2019   HGB 14.3 08/29/2019   HCT 41.8 08/29/2019   MCV 88 08/29/2019   PLT 206 08/29/2019   Lab Results  Component Value Date   NA 141 03/05/2020   K 4.0 03/05/2020   CO2 26 03/05/2020   GLUCOSE 102 (H) 03/05/2020   BUN 12 03/05/2020   CREATININE 0.78 03/05/2020   BILITOT 0.6 03/05/2020   ALKPHOS 64 03/05/2020   AST 23 03/05/2020   ALT 21 03/05/2020   PROT 6.6 03/05/2020   ALBUMIN 4.1 03/05/2020   CALCIUM 9.3 03/05/2020   Lab Results  Component Value Date   CHOL 204 (H) 03/05/2020   Lab Results  Component Value Date   HDL 56 03/05/2020   Lab Results  Component Value Date   LDLCALC 129 (H) 03/05/2020   Lab Results  Component Value Date   TRIG 109 03/05/2020   No results found for: CHOLHDL Lab Results  Component Value Date   HGBA1C 6.2 (H) 03/05/2020      Assessment & Plan:   Problem List Items Addressed This Visit      Cardiovascular and Mediastinum   Essential hypertension - Primary    Chronic, stable. BP today 145/84. Continue current regimen. Refills sent to pharmacy. Check CMP, CBC today. Follow-up in 6 months      Relevant Medications   atorvastatin (LIPITOR) 20 MG tablet   amLODipine (NORVASC) 5 MG tablet   Other Relevant Orders   Comp Met (CMET)   CBC with Differential     Endocrine   IFG (impaired fasting glucose)    A1C 6.2%. Continue lifestyle and diet control. Follow-up in 6 months      Relevant Orders   Bayer DCA Hb A1c Waived     Other   Hyperlipidemia    ASCVD score 41% today. With shared decision making will start lipitor today. Prescription sent to pharmacy. Check lipid panel.  Follow-up in 3 months.       Relevant Medications   atorvastatin (LIPITOR) 20 MG tablet   amLODipine (NORVASC) 5 MG tablet   Other Relevant Orders   Lipid Panel w/o Chol/HDL Ratio   Forgetfulness    Per family patient has been more forgetful recently and has locked herself out of her house twice. MMSE done and scored 23/30. Will continue to watch for now. Encouraged mind exercises like talking with family and friends, working on puzzles. If symptoms worsen or have concerns, call the office.          Meds ordered this encounter  Medications  . atorvastatin (LIPITOR) 20 MG tablet    Sig: Take 1 tablet (20 mg total) by mouth daily.    Dispense:  30 tablet    Refill:  2  . amLODipine (NORVASC) 5 MG tablet    Sig: TAKE 1 TABLET(5 MG) BY MOUTH DAILY    Dispense:  90 tablet    Refill:  1    Follow-up: Return in about 3 months (around 12/17/2020) for hld, physical, meet Santiago Glad, NP.    Charyl Dancer, NP

## 2020-09-17 NOTE — Assessment & Plan Note (Signed)
A1C 6.2%. Continue lifestyle and diet control. Follow-up in 6 months

## 2020-09-17 NOTE — Assessment & Plan Note (Signed)
ASCVD score 41% today. With shared decision making will start lipitor today. Prescription sent to pharmacy. Check lipid panel. Follow-up in 3 months.

## 2020-09-17 NOTE — Patient Instructions (Signed)
It was great to see you!  I refilled your blood pressure medicine and sent in a new prescription for cholesterol. If you have any side effects such as muscle cramps from the medicine, call the office  Let's follow-up in 3 months, sooner if you have concerns.  If a referral was placed today, you will be contacted for an appointment. Please note that routine referrals can sometimes take up to 3-4 weeks to process. Please call our office if you haven't heard anything after this time frame.  Take care,  Vance Peper, NP  Atorvastatin Tablets What is this medicine? ATORVASTATIN (a TORE va sta tin) is a statin. It lowers bad cholesterol and triglyceride levels in the blood. It also increases good cholesterol levels. It is used with lifestyle changes, like diet and exercise. It may be used alone or with other drugs. This medicine may be used for other purposes; ask your health care provider or pharmacist if you have questions. COMMON BRAND NAME(S): Lipitor What should I tell my health care provider before I take this medicine? They need to know if you have any of these conditions:  diabetes (high blood sugar)  if you often drink alcohol  kidney disease  liver disease  muscle cramps, pain  stroke  thyroid disease  an unusual or allergic reaction to atorvastatin, other medicines, foods, dyes, or preservatives  pregnant or trying to get pregnant  breast-feeding How should I use this medicine? Take this medicine by mouth. Take it as directed on the prescription label at the same time every day. You can take it with or without food. If it upsets your stomach, take it with food. Keep taking it unless your health care provider tells you to stop. Do not take this medicine with grapefruit juice. Talk to your health care provider about the use of this medicine in children. While it may be prescribed for children as young as 10 for selected conditions, precautions do apply. Overdosage: If  you think you have taken too much of this medicine contact a poison control center or emergency room at once. NOTE: This medicine is only for you. Do not share this medicine with others. What if I miss a dose? If you miss a dose, take it as soon as you can. If it is almost time for your next dose, take only that dose. Do not take double or extra doses. What may interact with this medicine? Do not take this medicine with any of the following medications:  dasabuvir; ombitasvir; paritaprevir; ritonavir  ombitasvir; paritaprevir; ritonavir  posaconazole  red yeast rice This medicine may also interact with the following medications:  alcohol  birth control pills  certain antibiotics like erythromycin and clarithromycin  certain antivirals for HIV or hepatitis  certain medicines for cholesterol like fenofibrate, gemfibrozil, and niacin  certain medicines for fungal infections like ketoconazole and itraconazole  colchicine  cyclosporine  digoxin  grapefruit juice  rifampin This list may not describe all possible interactions. Give your health care provider a list of all the medicines, herbs, non-prescription drugs, or dietary supplements you use. Also tell them if you smoke, drink alcohol, or use illegal drugs. Some items may interact with your medicine. What should I watch for while using this medicine? Visit your health care provider for regular checks on your progress. Tell your health care provider if your symptoms do not start to get better or if they get worse. Your health care provider may tell you to stop taking this medicine  if you develop muscle problems. If your muscle problems do not go away after stopping this medicine, contact your health care provider. Do not become pregnant while taking this medicine. Women should inform their health care provider if they wish to become pregnant or think they might be pregnant. There is potential for serious harm to an unborn child.  Talk to your health care provider for more information. Do not breast-feed an infant while taking this medicine. Birth control may not work properly while you are taking this medicine. Talk to your health care provider about using an extra method of birth control. This medicine may increase blood sugar. Ask your health care provider if changes in diet or medicines are needed if you have diabetes. If you are going to need surgery or other procedure, tell your health care provider that you are using this medicine. Taking this medicine is only part of a total heart healthy program. Your health care provider may give you a special diet to follow. Avoid alcohol. Avoid smoking. Ask your health care provider how much you should exercise. What side effects may I notice from receiving this medicine? Side effects that you should report to your doctor or health care provider as soon as possible:  allergic reactions (skin rash, itching or hives; swelling of the face, lips, or tongue)  high blood sugar (increased hunger, thirst or urination; unusually weak or tired, blurry vision)  infection (fever, chills, cough, sore throat, pain or trouble passing urine)  joint pain  liver injury (dark yellow or brown urine; general ill feeling or flu-like symptoms; loss of appetite, right upper belly pain; unusually weak or tired, yellowing of the eyes or skin)  muscle injury (dark urine; trouble passing urine or change in the amount of urine; unusually weak or tired; muscle pain; back pain)  redness, blistering, peeling, or loosening of the skin, including inside the mouth Side effects that usually do not require medical attention (report to your doctor or health care provider if they continue or are bothersome):  diarrhea  nausea  upset stomach This list may not describe all possible side effects. Call your doctor for medical advice about side effects. You may report side effects to FDA at 1-800-FDA-1088. Where  should I keep my medicine? Keep out of the reach of children and pets. Store at room temperature between 20 and 25 degrees C (68 and 77 degrees F). Get rid of any unused medicine after the expiration date. To get rid of medicines that are no longer needed or have expired:  Take the medicine to a medicine take-back program. Check with your pharmacy or law enforcement to find a location.  If you cannot return the medicine, check the label or package insert to see if the medicine should be thrown out in the garbage or flushed down the toilet. If you are not sure, ask your health care provider. If it is safe to put it in the trash, take the medicine out of the container. Mix the medicine with cat litter, dirt, coffee grounds, or other unwanted substance. Seal the mixture in a bag or container. Put it in the trash. NOTE: This sheet is a summary. It may not cover all possible information. If you have questions about this medicine, talk to your doctor, pharmacist, or health care provider.  2021 [employer]vier/Gold Standard (2020-05-21 12:41:57)

## 2020-09-17 NOTE — Assessment & Plan Note (Addendum)
Chronic, stable. BP today 145/84. Continue current regimen. Refills sent to pharmacy. Check CMP, CBC today. Follow-up in 6 months

## 2020-09-18 LAB — LIPID PANEL W/O CHOL/HDL RATIO
Cholesterol, Total: 201 mg/dL — ABNORMAL HIGH (ref 100–199)
HDL: 53 mg/dL (ref >39)
LDL Chol Calc (NIH): 129 mg/dL — ABNORMAL HIGH (ref 0–99)
Triglycerides: 107 mg/dL (ref 0–149)
VLDL Cholesterol Cal: 19 mg/dL (ref 5–40)

## 2020-09-18 LAB — CBC WITH DIFFERENTIAL/PLATELET
Basophils Absolute: 0 10*3/uL (ref 0.0–0.2)
Basos: 0 %
EOS (ABSOLUTE): 0.1 10*3/uL (ref 0.0–0.4)
Eos: 2 %
Hematocrit: 42.3 % (ref 34.0–46.6)
Hemoglobin: 14.2 g/dL (ref 11.1–15.9)
Immature Grans (Abs): 0 10*3/uL (ref 0.0–0.1)
Immature Granulocytes: 0 %
Lymphocytes Absolute: 1.9 10*3/uL (ref 0.7–3.1)
Lymphs: 31 %
MCH: 29.4 pg (ref 26.6–33.0)
MCHC: 33.6 g/dL (ref 31.5–35.7)
MCV: 88 fL (ref 79–97)
Monocytes Absolute: 0.5 10*3/uL (ref 0.1–0.9)
Monocytes: 8 %
Neutrophils Absolute: 3.7 10*3/uL (ref 1.4–7.0)
Neutrophils: 59 %
Platelets: 197 10*3/uL (ref 150–450)
RBC: 4.83 x10E6/uL (ref 3.77–5.28)
RDW: 12.4 % (ref 11.7–15.4)
WBC: 6.3 10*3/uL (ref 3.4–10.8)

## 2020-09-18 LAB — COMPREHENSIVE METABOLIC PANEL
ALT: 22 IU/L (ref 0–32)
AST: 27 IU/L (ref 0–40)
Albumin/Globulin Ratio: 1.5 (ref 1.2–2.2)
Albumin: 4.1 g/dL (ref 3.7–4.7)
Alkaline Phosphatase: 53 IU/L (ref 44–121)
BUN/Creatinine Ratio: 16 (ref 12–28)
BUN: 13 mg/dL (ref 8–27)
Bilirubin Total: 0.8 mg/dL (ref 0.0–1.2)
CO2: 23 mmol/L (ref 20–29)
Calcium: 9.6 mg/dL (ref 8.7–10.3)
Chloride: 104 mmol/L (ref 96–106)
Creatinine, Ser: 0.81 mg/dL (ref 0.57–1.00)
Globulin, Total: 2.8 g/dL (ref 1.5–4.5)
Glucose: 83 mg/dL (ref 65–99)
Potassium: 4 mmol/L (ref 3.5–5.2)
Sodium: 142 mmol/L (ref 134–144)
Total Protein: 6.9 g/dL (ref 6.0–8.5)
eGFR: 75 mL/min/{1.73_m2} (ref >59)

## 2020-09-18 LAB — BAYER DCA HB A1C WAIVED: HB A1C (BAYER DCA - WAIVED): 6.2 % (ref <7.0)

## 2020-11-26 ENCOUNTER — Other Ambulatory Visit: Payer: Self-pay | Admitting: Nurse Practitioner

## 2020-11-26 DIAGNOSIS — Z1231 Encounter for screening mammogram for malignant neoplasm of breast: Secondary | ICD-10-CM

## 2020-12-11 ENCOUNTER — Other Ambulatory Visit: Payer: Self-pay | Admitting: Nurse Practitioner

## 2020-12-17 ENCOUNTER — Other Ambulatory Visit: Payer: Self-pay

## 2020-12-17 ENCOUNTER — Encounter: Payer: Self-pay | Admitting: Nurse Practitioner

## 2020-12-17 ENCOUNTER — Ambulatory Visit (INDEPENDENT_AMBULATORY_CARE_PROVIDER_SITE_OTHER): Payer: Medicare Other | Admitting: Nurse Practitioner

## 2020-12-17 VITALS — BP 137/83 | HR 94 | Temp 97.7°F | Ht 66.0 in | Wt 196.2 lb

## 2020-12-17 DIAGNOSIS — Z0000 Encounter for general adult medical examination without abnormal findings: Secondary | ICD-10-CM

## 2020-12-17 DIAGNOSIS — I10 Essential (primary) hypertension: Secondary | ICD-10-CM

## 2020-12-17 DIAGNOSIS — Z1231 Encounter for screening mammogram for malignant neoplasm of breast: Secondary | ICD-10-CM

## 2020-12-17 DIAGNOSIS — E785 Hyperlipidemia, unspecified: Secondary | ICD-10-CM

## 2020-12-17 DIAGNOSIS — E119 Type 2 diabetes mellitus without complications: Secondary | ICD-10-CM | POA: Insufficient documentation

## 2020-12-17 DIAGNOSIS — R7301 Impaired fasting glucose: Secondary | ICD-10-CM | POA: Diagnosis not present

## 2020-12-17 LAB — URINALYSIS, ROUTINE W REFLEX MICROSCOPIC
Bilirubin, UA: NEGATIVE
Glucose, UA: NEGATIVE
Ketones, UA: NEGATIVE
Nitrite, UA: NEGATIVE
Protein,UA: NEGATIVE
RBC, UA: NEGATIVE
Specific Gravity, UA: 1.02 (ref 1.005–1.030)
Urobilinogen, Ur: 0.2 mg/dL (ref 0.2–1.0)
pH, UA: 6.5 (ref 5.0–7.5)

## 2020-12-17 LAB — MICROSCOPIC EXAMINATION

## 2020-12-17 NOTE — Assessment & Plan Note (Signed)
Chronic.  Controlled.  Continue with current medication regimen.  Labs ordered today.  Return to clinic in 6 months for reevaluation.  Call sooner if concerns arise.  ? ?

## 2020-12-17 NOTE — Progress Notes (Signed)
BP 137/83   Pulse 94   Temp 97.7 F (36.5 C) (Oral)   Ht 5\' 6"  (1.676 m)   Wt 196 lb 3.2 oz (89 kg)   SpO2 97%   BMI 31.67 kg/m    Subjective:    Patient ID: Carol Armstrong, female    DOB: [birth year], 80 y.o.   MRN: [id number]  HPI: Carol Armstrong is a 80 y.o. female presenting on 12/17/2020 for comprehensive medical examination. Current medical complaints include:none  She currently lives with: Menopausal Symptoms: no  HYPERTENSION / HYPERLIPIDEMIA Satisfied with current treatment? no Duration of hypertension: years BP monitoring frequency: not checking BP range:  BP medication side effects: no Past BP meds: amlodipine Duration of hyperlipidemia: years Cholesterol medication side effects: no Cholesterol supplements: none Past cholesterol medications: atorvastain (lipitor) Medication compliance: excellent compliance Aspirin: no Recent stressors: no Recurrent headaches: no Visual changes: no Palpitations: no Dyspnea: no Chest pain: no Lower extremity edema: no Dizzy/lightheaded: no  Depression Screen done today and results listed below:  Depression screen [facility] 2/9 12/17/2020 02/10/2020 08/29/2019 08/01/2019  Decreased Interest 0 0 0 0  Down, Depressed, Hopeless 0 0 0 0  PHQ - 2 Score 0 0 0 0  Altered sleeping - - 0 0  Tired, decreased energy - - 0 0  Change in appetite - - 0 0  Feeling bad or failure about yourself  - - 0 0  Trouble concentrating - - 0 0  Moving slowly or fidgety/restless - - 0 0  Suicidal thoughts - - 0 0  PHQ-9 Score - - 0 0    The patient does not have a history of falls. I did complete a risk assessment for falls. A plan of care for falls was documented.   Past Medical History:  Past Medical History:  Diagnosis Date   Hypertension    Prediabetes    Wears dentures    partial upper and lower    Surgical History:  Past Surgical History:  Procedure Laterality Date   COLONOSCOPY     COLONOSCOPY WITH PROPOFOL N/A 10/07/2019   Procedure:  COLONOSCOPY WITH PROPOFOL;  Surgeon: Lucilla Lame, MD;  Location: [place];  Service: Endoscopy;  Laterality: N/A;  Latex  priority 4   POLYPECTOMY  10/07/2019   Procedure: POLYPECTOMY;  Surgeon: Lucilla Lame, MD;  Location: [place];  Service: Endoscopy;;   TUBAL LIGATION      Medications:  Current Outpatient Medications on File Prior to Visit  Medication Sig   amLODipine (NORVASC) 5 MG tablet TAKE 1 TABLET(5 MG) BY MOUTH DAILY   atorvastatin (LIPITOR) 20 MG tablet TAKE 1 TABLET(20 MG) BY MOUTH DAILY   Multiple Vitamin (MULTIVITAMIN) capsule Take by mouth.   No current facility-administered medications on file prior to visit.    Allergies:  Allergies  Allergen Reactions   Latex Itching and Swelling    When gloves are WORN    Social History:  Social History   Socioeconomic History   Marital status: Divorced    Spouse name: Not on file   Number of children: Not on file   Years of education: Not on file   Highest education level: Not on file  Occupational History   Occupation: retired  Tobacco Use   Smoking status: Former    Pack years: 0.00   Smokeless tobacco: Never   Tobacco comments:    as teenager  Scientific laboratory technician Use: Never used  Substance and Sexual Activity  Alcohol use: Yes    Comment: occasinally wine   Drug use: Never   Sexual activity: Not Currently  Other Topics Concern   Not on file  Social History Narrative   Not on file   Social Determinants of Health   Financial Resource Strain: Low Risk    Difficulty of Paying Living Expenses: Not hard at all  Food Insecurity: No Food Insecurity   Worried About Charity fundraiser in the Last Year: Never true   [place] in the Last Year: Never true  Transportation Needs: No Transportation Needs   Lack of Transportation (Medical): No   Lack of Transportation (Non-Medical): No  Physical Activity: Inactive   Days of Exercise per Week: 0 days   Minutes of Exercise per  Session: 0 min  Stress: No Stress Concern Present   Feeling of Stress : Not at all  Social Connections: Not on file  Intimate Partner Violence: Not on file   Social History   Tobacco Use  Smoking Status Former   Pack years: 0.00  Smokeless Tobacco Never  Tobacco Comments   as teenager   Social History   Substance and Sexual Activity  Alcohol Use Yes   Comment: occasinally wine    Family History:  Family History  Problem Relation Age of Onset   Hypertension Mother    Prostate cancer Father    Hypertension Sister    Hypertension Brother    Hypertension Sister    Hypertension Sister    Hypertension Sister    Breast cancer Neg Hx     Past medical history, surgical history, medications, allergies, family history and social history reviewed with patient today and changes made to appropriate areas of the chart.   Review of Systems  Eyes:  Negative for blurred vision and double vision.  Respiratory:  Negative for shortness of breath.   Cardiovascular:  Negative for chest pain, palpitations and leg swelling.  Neurological:  Negative for dizziness and headaches.  All other ROS negative except what is listed above and in the HPI.      Objective:    BP 137/83   Pulse 94   Temp 97.7 F (36.5 C) (Oral)   Ht $R'5\' 6"'Ik$  (1.676 m)   Wt 196 lb 3.2 oz (89 kg)   SpO2 97%   BMI 31.67 kg/m   Wt Readings from Last 3 Encounters:  12/17/20 196 lb 3.2 oz (89 kg)  09/17/20 180 lb (81.6 kg)  03/05/20 199 lb (90.3 kg)    Physical Exam Vitals and nursing note reviewed.  Constitutional:      General: She is awake. She is not in acute distress.    Appearance: She is well-developed. She is not ill-appearing.  HENT:     Head: Normocephalic and atraumatic.     Right Ear: Hearing, tympanic membrane, ear canal and external ear normal. No drainage.     Left Ear: Hearing, tympanic membrane, ear canal and external ear normal. No drainage.     Nose: Nose normal.     Right Sinus: No maxillary  sinus tenderness or frontal sinus tenderness.     Left Sinus: No maxillary sinus tenderness or frontal sinus tenderness.     Mouth/Throat:     Mouth: Mucous membranes are moist.     Pharynx: Oropharynx is clear. Uvula midline. No pharyngeal swelling, oropharyngeal exudate or posterior oropharyngeal erythema.  Eyes:     General: Lids are normal.        Right  eye: No discharge.        Left eye: No discharge.     Extraocular Movements: Extraocular movements intact.     Conjunctiva/sclera: Conjunctivae normal.     Pupils: Pupils are equal, round, and reactive to light.     Visual Fields: Right eye visual fields normal and left eye visual fields normal.  Neck:     Thyroid: No thyromegaly.     Vascular: No carotid bruit.     Trachea: Trachea normal.  Cardiovascular:     Rate and Rhythm: Normal rate and regular rhythm.     Heart sounds: Normal heart sounds. No murmur heard.   No gallop.  Pulmonary:     Effort: Pulmonary effort is normal. No accessory muscle usage or respiratory distress.     Breath sounds: Normal breath sounds.  Chest:  Breasts:    Right: Normal. No axillary adenopathy or supraclavicular adenopathy.     Left: Normal. No axillary adenopathy or supraclavicular adenopathy.  Abdominal:     General: Bowel sounds are normal.     Palpations: Abdomen is soft. There is no hepatomegaly or splenomegaly.     Tenderness: There is no abdominal tenderness.  Musculoskeletal:        General: Normal range of motion.     Cervical back: Normal range of motion and neck supple.     Right lower leg: No edema.     Left lower leg: No edema.  Lymphadenopathy:     Head:     Right side of head: No submental, submandibular, tonsillar, preauricular or posterior auricular adenopathy.     Left side of head: No submental, submandibular, tonsillar, preauricular or posterior auricular adenopathy.     Cervical: No cervical adenopathy.     Upper Body:     Right upper body: No supraclavicular,  axillary or pectoral adenopathy.     Left upper body: No supraclavicular, axillary or pectoral adenopathy.  Skin:    General: Skin is warm and dry.     Capillary Refill: Capillary refill takes less than 2 seconds.     Findings: No rash.  Neurological:     Mental Status: She is alert and oriented to person, place, and time.     Cranial Nerves: Cranial nerves are intact.     Gait: Gait is intact.     Deep Tendon Reflexes: Reflexes are normal and symmetric.     Reflex Scores:      Brachioradialis reflexes are 2+ on the right side and 2+ on the left side.      Patellar reflexes are 2+ on the right side and 2+ on the left side. Psychiatric:        Attention and Perception: Attention normal.        Mood and Affect: Mood normal.        Speech: Speech normal.        Behavior: Behavior normal. Behavior is cooperative.        Thought Content: Thought content normal.        Judgment: Judgment normal.    Results for orders placed or performed in visit on 09/17/20  Comp Met (CMET)  Result Value Ref Range   Glucose 83 65 - 99 mg/dL   BUN 13 8 - 27 mg/dL   Creatinine, Ser 0.81 0.57 - 1.00 mg/dL   eGFR 75 >59 mL/min/1.73   BUN/Creatinine Ratio 16 12 - 28   Sodium 142 134 - 144 mmol/L   Potassium 4.0 3.5 - 5.2 mmol/L  Chloride 104 96 - 106 mmol/L   CO2 23 20 - 29 mmol/L   Calcium 9.6 8.7 - 10.3 mg/dL   Total Protein 6.9 6.0 - 8.5 g/dL   Albumin 4.1 3.7 - 4.7 g/dL   Globulin, Total 2.8 1.5 - 4.5 g/dL   Albumin/Globulin Ratio 1.5 1.2 - 2.2   Bilirubin Total 0.8 0.0 - 1.2 mg/dL   Alkaline Phosphatase 53 44 - 121 IU/L   AST 27 0 - 40 IU/L   ALT 22 0 - 32 IU/L  CBC with Differential  Result Value Ref Range   WBC 6.3 3.4 - 10.8 x10E3/uL   RBC 4.83 3.77 - 5.28 x10E6/uL   Hemoglobin 14.2 11.1 - 15.9 g/dL   Hematocrit 42.3 34.0 - 46.6 %   MCV 88 79 - 97 fL   MCH 29.4 26.6 - 33.0 pg   MCHC 33.6 31.5 - 35.7 g/dL   RDW 12.4 11.7 - 15.4 %   Platelets 197 150 - 450 x10E3/uL   Neutrophils 59  Not Estab. %   Lymphs 31 Not Estab. %   Monocytes 8 Not Estab. %   Eos 2 Not Estab. %   Basos 0 Not Estab. %   Neutrophils Absolute 3.7 1.4 - 7.0 x10E3/uL   Lymphocytes Absolute 1.9 0.7 - 3.1 x10E3/uL   Monocytes Absolute 0.5 0.1 - 0.9 x10E3/uL   EOS (ABSOLUTE) 0.1 0.0 - 0.4 x10E3/uL   Basophils Absolute 0.0 0.0 - 0.2 x10E3/uL   Immature Granulocytes 0 Not Estab. %   Immature Grans (Abs) 0.0 0.0 - 0.1 x10E3/uL  Bayer DCA Hb A1c Waived  Result Value Ref Range   HB A1C (BAYER DCA - WAIVED) 6.2 <7.0 %  Lipid Panel w/o Chol/HDL Ratio  Result Value Ref Range   Cholesterol, Total 201 (H) 100 - 199 mg/dL   Triglycerides 107 0 - 149 mg/dL   HDL 53 >39 mg/dL   VLDL Cholesterol Cal 19 5 - 40 mg/dL   LDL Chol Calc (NIH) 129 (H) 0 - 99 mg/dL      Assessment & Plan:   Problem List Items Addressed This Visit       Cardiovascular and Mediastinum   Essential hypertension    Chronic.  Controlled.  Continue with current medication regimen.  Labs ordered today.  Return to clinic in 6 months for reevaluation.  Call sooner if concerns arise.         Relevant Orders   Comprehensive metabolic panel     Endocrine   IFG (impaired fasting glucose)    Chronic.  Controlled.  Continue with current medication regimen.  Labs ordered today.  Return to clinic in 6 months for reevaluation.  Call sooner if concerns arise.         Relevant Orders   HgB A1c   Microalbumin, Urine Waived     Other   Hyperlipidemia    Chronic.  Controlled.  Continue with current medication regimen.  Labs ordered today.  Return to clinic in 6 months for reevaluation.  Call sooner if concerns arise.         Relevant Orders   Lipid panel   Other Visit Diagnoses     Annual physical exam    -  Primary   Health maintenance reviewed today. Labs ordered today.  Reviewed Shingles vaccine and Tetnanus vaccine.   Relevant Orders   CBC with Differential/Platelet   Comprehensive metabolic panel   Lipid panel   TSH    Urinalysis, Routine w reflex  microscopic   Encounter for screening mammogram for malignant neoplasm of breast       Relevant Orders   MM Digital Screening        Follow up plan: Return in about 6 months (around 06/18/2021) for HTN, HLD, DM2 FU.   LABORATORY TESTING:  - Pap smear: not applicable  IMMUNIZATIONS:   - Tdap: Tetanus vaccination status reviewed: not up to date. - Influenza: Postponed to flu season - Pneumovax: Up to date - Prevnar: Up to date - HPV: Not applicable - Zostavax vaccine:  Discussed today  SCREENING: -Mammogram: Ordered today  - Colonoscopy: Up to date  - Bone Density: Up to date  -Hearing Test: Not applicable  -Spirometry: Not applicable   PATIENT COUNSELING:   Advised to take 1 mg of folate supplement per day if capable of pregnancy.   Sexuality: Discussed sexually transmitted diseases, partner selection, use of condoms, avoidance of unintended pregnancy  and contraceptive alternatives.   Advised to avoid cigarette smoking.  I discussed with the patient that most people either abstain from alcohol or drink within safe limits (<=14/week and <=4 drinks/occasion for males, <=7/weeks and <= 3 drinks/occasion for females) and that the risk for alcohol disorders and other health effects rises proportionally with the number of drinks per week and how often a drinker exceeds daily limits.  Discussed cessation/primary prevention of drug use and availability of treatment for abuse.   Diet: Encouraged to adjust caloric intake to maintain  or achieve ideal body weight, to reduce intake of dietary saturated fat and total fat, to limit sodium intake by avoiding high sodium foods and not adding table salt, and to maintain adequate dietary potassium and calcium preferably from fresh fruits, vegetables, and low-fat dairy products.    stressed the importance of regular exercise  Injury prevention: Discussed safety belts, safety helmets, smoke detector, smoking near  bedding or upholstery.   Dental health: Discussed importance of regular tooth brushing, flossing, and dental visits.    NEXT PREVENTATIVE PHYSICAL DUE IN 1 YEAR. Return in about 6 months (around 06/18/2021) for HTN, HLD, DM2 FU.

## 2020-12-18 LAB — CBC WITH DIFFERENTIAL/PLATELET
Basophils Absolute: 0 10*3/uL (ref 0.0–0.2)
Basos: 1 %
EOS (ABSOLUTE): 0.2 10*3/uL (ref 0.0–0.4)
Eos: 3 %
Hematocrit: 41.5 % (ref 34.0–46.6)
Hemoglobin: 14.2 g/dL (ref 11.1–15.9)
Immature Grans (Abs): 0 10*3/uL (ref 0.0–0.1)
Immature Granulocytes: 0 %
Lymphocytes Absolute: 2.2 10*3/uL (ref 0.7–3.1)
Lymphs: 34 %
MCH: 29.9 pg (ref 26.6–33.0)
MCHC: 34.2 g/dL (ref 31.5–35.7)
MCV: 87 fL (ref 79–97)
Monocytes Absolute: 0.5 10*3/uL (ref 0.1–0.9)
Monocytes: 8 %
Neutrophils Absolute: 3.5 10*3/uL (ref 1.4–7.0)
Neutrophils: 54 %
Platelets: 216 10*3/uL (ref 150–450)
RBC: 4.75 x10E6/uL (ref 3.77–5.28)
RDW: 11.9 % (ref 11.7–15.4)
WBC: 6.4 10*3/uL (ref 3.4–10.8)

## 2020-12-18 LAB — COMPREHENSIVE METABOLIC PANEL
ALT: 26 IU/L (ref 0–32)
AST: 28 IU/L (ref 0–40)
Albumin/Globulin Ratio: 1.8 (ref 1.2–2.2)
Albumin: 4.4 g/dL (ref 3.7–4.7)
Alkaline Phosphatase: 68 IU/L (ref 44–121)
BUN/Creatinine Ratio: 14 (ref 12–28)
BUN: 11 mg/dL (ref 8–27)
Bilirubin Total: 0.7 mg/dL (ref 0.0–1.2)
CO2: 26 mmol/L (ref 20–29)
Calcium: 9.7 mg/dL (ref 8.7–10.3)
Chloride: 101 mmol/L (ref 96–106)
Creatinine, Ser: 0.78 mg/dL (ref 0.57–1.00)
Globulin, Total: 2.5 g/dL (ref 1.5–4.5)
Glucose: 100 mg/dL — ABNORMAL HIGH (ref 65–99)
Potassium: 4.1 mmol/L (ref 3.5–5.2)
Sodium: 142 mmol/L (ref 134–144)
Total Protein: 6.9 g/dL (ref 6.0–8.5)
eGFR: 78 mL/min/{1.73_m2} (ref >59)

## 2020-12-18 LAB — LIPID PANEL
Chol/HDL Ratio: 1.9 ratio (ref 0.0–4.4)
Cholesterol, Total: 111 mg/dL (ref 100–199)
HDL: 57 mg/dL (ref >39)
LDL Chol Calc (NIH): 39 mg/dL (ref 0–99)
Triglycerides: 71 mg/dL (ref 0–149)
VLDL Cholesterol Cal: 15 mg/dL (ref 5–40)

## 2020-12-18 LAB — HEMOGLOBIN A1C
Est. average glucose Bld gHb Est-mCnc: 148 mg/dL
Hgb A1c MFr Bld: 6.8 % — ABNORMAL HIGH (ref 4.8–5.6)

## 2020-12-18 LAB — TSH: TSH: 2.36 u[IU]/mL (ref 0.450–4.500)

## 2020-12-22 NOTE — Progress Notes (Signed)
Hi Carol Armstrong. It was good to see you last week.  Your lab work shows that your a1c went up to 6.8.  Make sure you are following a low carb diet.  Otherwise, lab work looks good.  Please let me know if you have any questions.

## 2020-12-25 ENCOUNTER — Telehealth: Payer: Self-pay

## 2020-12-25 NOTE — Telephone Encounter (Signed)
Called and spoke to patient. She states that she would not see the message from [place]. Gave lab result message to patient from [place].

## 2020-12-25 NOTE — Telephone Encounter (Signed)
Copied from [place] [id number]. Topic: General - Other >> Dec 25, 2020  3:22 PM [name] wrote: Reason for CRM: patient received a alert through My Chart but unable to access (will have grand daughter assist with My Chart) patient would like a follow up call because she is unsure if its concerning her lab results

## 2020-12-28 ENCOUNTER — Ambulatory Visit: Payer: Medicare Other

## 2020-12-29 ENCOUNTER — Other Ambulatory Visit: Payer: Self-pay

## 2020-12-29 ENCOUNTER — Ambulatory Visit
Admission: RE | Admit: 2020-12-29 | Discharge: 2020-12-29 | Disposition: A | Discharge status: Home / Self Care | Payer: Medicare Other | Source: Ambulatory Visit | Attending: Nurse Practitioner | Admitting: Nurse Practitioner

## 2020-12-29 DIAGNOSIS — Z1231 Encounter for screening mammogram for malignant neoplasm of breast: Secondary | ICD-10-CM | POA: Diagnosis present

## 2021-02-10 ENCOUNTER — Ambulatory Visit: Payer: Medicare Other

## 2021-02-12 ENCOUNTER — Ambulatory Visit (INDEPENDENT_AMBULATORY_CARE_PROVIDER_SITE_OTHER): Payer: Medicare Other

## 2021-02-12 VITALS — Ht 66.5 in | Wt 196.0 lb

## 2021-02-12 DIAGNOSIS — Z0000 Encounter for general adult medical examination without abnormal findings: Secondary | ICD-10-CM

## 2021-02-12 NOTE — Patient Instructions (Signed)
Carol Armstrong , Thank you for taking time to come for your Medicare Wellness Visit. I appreciate your ongoing commitment to your health goals. Please review the following plan we discussed and let me know if I can assist you in the future.   Screening recommendations/referrals: Colonoscopy: not required Mammogram: completed 12/29/2020 Bone Density: completed 08/18/2008 Recommended yearly ophthalmology/optometry visit for glaucoma screening and checkup Recommended yearly dental visit for hygiene and checkup  Vaccinations: Influenza vaccine: due Pneumococcal vaccine: completed 08/29/2019 Tdap vaccine: due Shingles vaccine: discussed   Covid-19:11/11/2020, 11/11/2019, 10/21/2019  Advanced directives: Advance directive discussed with you today.   Conditions/risks identified: none  Next appointment: Follow up in one year for your annual wellness visit    Preventive Care 65 Years and Older, Female Preventive care refers to lifestyle choices and visits with your health care provider that can promote health and wellness. What does preventive care include? A yearly physical exam. This is also called an annual well check. Dental exams once or twice a year. Routine eye exams. Ask your health care provider how often you should have your eyes checked. Personal lifestyle choices, including: Daily care of your teeth and gums. Regular physical activity. Eating a healthy diet. Avoiding tobacco and drug use. Limiting alcohol use. Practicing safe sex. Taking low-dose aspirin every day. Taking vitamin and mineral supplements as recommended by your health care provider. What happens during an annual well check? The services and screenings done by your health care provider during your annual well check will depend on your age, overall health, lifestyle risk factors, and family history of disease. Counseling  Your health care provider may ask you questions about your: Alcohol use. Tobacco use. Drug  use. Emotional well-being. Home and relationship well-being. Sexual activity. Eating habits. History of falls. Memory and ability to understand (cognition). Work and work Statistician. Reproductive health. Screening  You may have the following tests or measurements: Height, weight, and BMI. Blood pressure. Lipid and cholesterol levels. These may be checked every 5 years, or more frequently if you are over 27 years old. Skin check. Lung cancer screening. You may have this screening every year starting at age 46 if you have a 30-pack-year history of smoking and currently smoke or have quit within the past 15 years. Fecal occult blood test (FOBT) of the stool. You may have this test every year starting at age 69. Flexible sigmoidoscopy or colonoscopy. You may have a sigmoidoscopy every 5 years or a colonoscopy every 10 years starting at age 48. Hepatitis C blood test. Hepatitis B blood test. Sexually transmitted disease (STD) testing. Diabetes screening. This is done by checking your blood sugar (glucose) after you have not eaten for a while (fasting). You may have this done every 1-3 years. Bone density scan. This is done to screen for osteoporosis. You may have this done starting at age 17. Mammogram. This may be done every 1-2 years. Talk to your health care provider about how often you should have regular mammograms. Talk with your health care provider about your test results, treatment options, and if necessary, the need for more tests. Vaccines  Your health care provider may recommend certain vaccines, such as: Influenza vaccine. This is recommended every year. Tetanus, diphtheria, and acellular pertussis (Tdap, Td) vaccine. You may need a Td booster every 10 years. Zoster vaccine. You may need this after age 64. Pneumococcal 13-valent conjugate (PCV13) vaccine. One dose is recommended after age 53. Pneumococcal polysaccharide (PPSV23) vaccine. One dose is recommended after age  78. Talk to your health care provider about which screenings and vaccines you need and how often you need them. This information is not intended to replace advice given to you by your health care provider. Make sure you discuss any questions you have with your health care provider. Document Released: 07/03/2015 Document Revised: 02/24/2016 Document Reviewed: 04/07/2015 Elsevier Interactive Patient Education  2017 [place] Prevention in the Home Falls can cause injuries. They can happen to people of all ages. There are many things you can do to make your home safe and to help prevent falls. What can I do on the outside of my home? Regularly fix the edges of walkways and driveways and fix any cracks. Remove anything that might make you trip as you walk through a door, such as a raised step or threshold. Trim any bushes or trees on the path to your home. Use bright outdoor lighting. Clear any walking paths of anything that might make someone trip, such as rocks or tools. Regularly check to see if handrails are loose or broken. Make sure that both sides of any steps have handrails. Any raised decks and porches should have guardrails on the edges. Have any leaves, snow, or ice cleared regularly. Use sand or salt on walking paths during winter. Clean up any spills in your garage right away. This includes oil or grease spills. What can I do in the bathroom? Use night lights. Install grab bars by the toilet and in the tub and shower. Do not use towel bars as grab bars. Use non-skid mats or decals in the tub or shower. If you need to sit down in the shower, use a plastic, non-slip stool. Keep the floor dry. Clean up any water that spills on the floor as soon as it happens. Remove soap buildup in the tub or shower regularly. Attach bath mats securely with double-sided non-slip rug tape. Do not have throw rugs and other things on the floor that can make you trip. What can I do in the  bedroom? Use night lights. Make sure that you have a light by your bed that is easy to reach. Do not use any sheets or blankets that are too big for your bed. They should not hang down onto the floor. Have a firm chair that has side arms. You can use this for support while you get dressed. Do not have throw rugs and other things on the floor that can make you trip. What can I do in the kitchen? Clean up any spills right away. Avoid walking on wet floors. Keep items that you use a lot in easy-to-reach places. If you need to reach something above you, use a strong step stool that has a grab bar. Keep electrical cords out of the way. Do not use floor polish or wax that makes floors slippery. If you must use wax, use non-skid floor wax. Do not have throw rugs and other things on the floor that can make you trip. What can I do with my stairs? Do not leave any items on the stairs. Make sure that there are handrails on both sides of the stairs and use them. Fix handrails that are broken or loose. Make sure that handrails are as long as the stairways. Check any carpeting to make sure that it is firmly attached to the stairs. Fix any carpet that is loose or worn. Avoid having throw rugs at the top or bottom of the stairs. If you do have throw  rugs, attach them to the floor with carpet tape. Make sure that you have a light switch at the top of the stairs and the bottom of the stairs. If you do not have them, ask someone to add them for you. What else can I do to help prevent falls? Wear shoes that: Do not have high heels. Have rubber bottoms. Are comfortable and fit you well. Are closed at the toe. Do not wear sandals. If you use a stepladder: Make sure that it is fully opened. Do not climb a closed stepladder. Make sure that both sides of the stepladder are locked into place. Ask someone to hold it for you, if possible. Clearly mark and make sure that you can see: Any grab bars or  handrails. First and last steps. Where the edge of each step is. Use tools that help you move around (mobility aids) if they are needed. These include: Canes. Walkers. Scooters. Crutches. Turn on the lights when you go into a dark area. Replace any light bulbs as soon as they burn out. Set up your furniture so you have a clear path. Avoid moving your furniture around. If any of your floors are uneven, fix them. If there are any pets around you, be aware of where they are. Review your medicines with your doctor. Some medicines can make you feel dizzy. This can increase your chance of falling. Ask your doctor what other things that you can do to help prevent falls. This information is not intended to replace advice given to you by your health care provider. Make sure you discuss any questions you have with your health care provider. Document Released: 04/02/2009 Document Revised: 11/12/2015 Document Reviewed: 07/11/2014 Elsevier Interactive Patient Education  2017 [employer].

## 2021-02-12 NOTE — Progress Notes (Signed)
I connected with Carol Armstrong today by telephone and verified that I am speaking with the correct person using two identifiers. Location patient: home Location provider: work Persons participating in the virtual visit: Carol Armstrong, Glenna Durand LPN.   I discussed the limitations, risks, security and privacy concerns of performing an evaluation and management service by telephone and the availability of in person appointments. I also discussed with the patient that there may be a patient responsible charge related to this service. The patient expressed understanding and verbally consented to this telephonic visit.    Interactive audio and video telecommunications were attempted between this provider and patient, however failed, due to patient having technical difficulties OR patient did not have access to video capability.  We continued and completed visit with audio only.     Vital signs may be patient reported or missing.  Subjective:   Carol Armstrong is a 80 y.o. female who presents for Medicare Annual (Subsequent) preventive examination.  Review of Systems     Cardiac Risk Factors include: advanced age (>43mn, >>18women);diabetes mellitus;dyslipidemia;hypertension;obesity (BMI >30kg/m2)     Objective:    Today's Vitals   02/12/21 1341  Weight: 196 lb (88.9 kg)  Height: 5' 6.5" (1.689 m)   Body mass index is 31.16 kg/m.  Advanced Directives 02/12/2021 02/10/2020 10/07/2019  Does Patient Have a Medical Advance Directive? No No No  Would patient like information on creating a medical advance directive? - - No - Patient declined    Current Medications (verified) Outpatient Encounter Medications as of 02/12/2021  Medication Sig   amLODipine (NORVASC) 5 MG tablet TAKE 1 TABLET(5 MG) BY MOUTH DAILY   atorvastatin (LIPITOR) 20 MG tablet TAKE 1 TABLET(20 MG) BY MOUTH DAILY   Multiple Vitamin (MULTIVITAMIN) capsule Take by mouth.   No facility-administered encounter medications on  file as of 02/12/2021.    Allergies (verified) Latex   History: Past Medical History:  Diagnosis Date   Hypertension    Prediabetes    Wears dentures    partial upper and lower   Past Surgical History:  Procedure Laterality Date   COLONOSCOPY     COLONOSCOPY WITH PROPOFOL N/A 10/07/2019   Procedure: COLONOSCOPY WITH PROPOFOL;  Surgeon: WLucilla Lame MD;  [facility]  Service: Endoscopy;  Laterality: N/A;  Latex  priority 4   POLYPECTOMY  10/07/2019   Procedure: POLYPECTOMY;  Surgeon: WLucilla Lame MD;  Location[facility]  Service: Endoscopy;;   TUBAL LIGATION     Family History  Problem Relation Age of Onset   Hypertension Mother    Prostate cancer Father    Hypertension Sister    Hypertension Brother    Hypertension Sister    Hypertension Sister    Hypertension Sister    Breast cancer Neg Hx    Social History   Socioeconomic History   Marital status: Divorced    Spouse name: Not on file   Number of children: Not on file   Years of education: Not on file   Highest education level: Not on file  Occupational History   Occupation: retired  Tobacco Use   Smoking status: Former   Smokeless tobacco: Never   Tobacco comments:    as teenager  VScientific laboratory technicianUse: Never used  Substance and Sexual Activity   Alcohol use: Not Currently    Comment: occasinally wine   Drug use: Never   Sexual activity: Not Currently  Other Topics Concern   Not  on file  Social History Narrative   Not on file   Social Determinants of Health   Financial Resource Strain: Low Risk    Difficulty of Paying Living Expenses: Not hard at all  Food Insecurity: No Food Insecurity   Worried About Charity fundraiser in the Last Year: Never true   Arboriculturist in the Last Year: Never true  Transportation Needs: No Transportation Needs   Lack of Transportation (Medical): No   Lack of Transportation (Non-Medical): No  Physical Activity: Sufficiently Active    Days of Exercise per Week: 5 days   Minutes of Exercise per Session: 90 min  Stress: No Stress Concern Present   Feeling of Stress : Not at all  Social Connections: Not on file    Tobacco Counseling Counseling given: Not Answered Tobacco comments: as teenager   Clinical Intake:  Pre-visit preparation completed: Yes  Pain : No/denies pain     Nutritional Status: BMI > 30  Obese Nutritional Risks: None Diabetes: Yes  How often do you need to have someone help you when you read instructions, pamphlets, or other written materials from your doctor or pharmacy?: 1 - Never What is the last grade level you completed in school?: college  Diabetic? Yes Nutrition Risk Assessment:  Has the patient had any N/V/D within the last 2 months?  No  Does the patient have any non-healing wounds?  No  Has the patient had any unintentional weight loss or weight gain?  No   Diabetes:  Is the patient diabetic?  Yes  If diabetic, was a CBG obtained today?  No  Did the patient bring in their glucometer from home?  No  How often do you monitor your CBG's? Does not.   Financial Strains and Diabetes Management:  Are you having any financial strains with the device, your supplies or your medication? No .  Does the patient want to be seen by Chronic Care Management for management of their diabetes?  No  Would the patient like to be referred to a Nutritionist or for Diabetic Management?  No   Diabetic Exams:  Diabetic Eye Exam: Overdue for diabetic eye exam. Pt has been advised about the importance in completing this exam. Patient advised to call and schedule an eye exam. Diabetic Foot Exam: Overdue, Pt has been advised about the importance in completing this exam. Pt is scheduled for diabetic foot exam on next appointment.   Interpreter Needed?: No  Information entered by :: NAllen LPN   Activities of Daily Living In your present state of health, do you have any difficulty performing  the following activities: 02/12/2021  Hearing? N  Vision? N  Difficulty concentrating or making decisions? N  Walking or climbing stairs? N  Dressing or bathing? N  Doing errands, shopping? N  Preparing Food and eating ? N  Using the Toilet? N  In the past six months, have you accidently leaked urine? N  Do you have problems with loss of bowel control? N  Managing your Medications? N  Managing your Finances? N  Housekeeping or managing your Housekeeping? N  Some recent data might be hidden    Patient Care Team: Jon Billings, NP as PCP - General  Indicate any recent Medical Services you may have received from other than [facility] providers in the past year (date may be approximate).     Assessment:   This is a routine wellness examination for [facility].  Hearing/Vision screen Vision Screening - Comments::  Regular eye exams, [place] issues and exercise activities discussed: Current Exercise Habits: Home exercise routine, Time (Minutes): > 60, Frequency (Times/Week): 3, Weekly Exercise (Minutes/Week): 0   Goals Addressed             This Visit's Progress    Patient Stated       02/12/2021, wants to lose 10 pounds       Depression Screen PHQ 2/9 Scores 02/12/2021 12/17/2020 02/10/2020 08/29/2019 08/01/2019  PHQ - 2 Score 0 0 0 0 0  PHQ- 9 Score - - - 0 0    Fall Risk Fall Risk  02/12/2021 02/10/2020 08/29/2019 08/01/2019  Falls in the past year? 0 0 0 0  Number falls in past yr: - - 0 0  Injury with Fall? - - 0 0  Risk for fall due to : Medication side effect No Fall Risks - -  Follow up Falls evaluation completed;Education provided;Falls prevention discussed Falls evaluation completed;Education provided;Falls prevention discussed - -    FALL RISK PREVENTION PERTAINING TO THE HOME:  Any stairs in or around the home? No  If so, are there any without handrails?  N/a Home free of loose throw rugs in walkways, pet beds, electrical cords, etc? Yes  Adequate  lighting in your home to reduce risk of falls? Yes   ASSISTIVE DEVICES UTILIZED TO PREVENT FALLS:  Life alert? No  Use of a cane, walker or w/c? No  Grab bars in the bathroom? Yes  Shower chair or bench in shower? No  Elevated toilet seat or a handicapped toilet? Yes   TIMED UP AND GO:  Was the test performed? No .       Cognitive Function: MMSE - Mini Mental State Exam 09/17/2020  Orientation to time 5  Orientation to Place 5  Registration 3  Attention/ Calculation 0  Recall 2  Language- name 2 objects 2  Language- repeat 1  Language- follow 3 step command 3  Language- read & follow direction 1  Write a sentence 1  Copy design 0  Total score 23     6CIT Screen 02/12/2021 02/10/2020  What Year? 0 points 0 points  What month? 0 points 0 points  What time? 3 points 0 points  Count back from 20 0 points 4 points  Months in reverse 2 points 2 points  Repeat phrase 8 points 4 points  Total Score 13 10    Immunizations Immunization History  Administered Date(s) Administered   Fluad Quad(high Dose 65+) 03/05/2020   Influenza, High Dose Seasonal PF 04/09/2018   Influenza-Unspecified 06/11/2015, 05/07/2016, 06/04/2019   PFIZER(Purple Top)SARS-COV-2 Vaccination 10/21/2019, 11/11/2019, 11/11/2020   Pneumococcal Conjugate-13 12/12/2015   Pneumococcal Polysaccharide-23 08/29/2019    TDAP status: Due, Education has been provided regarding the importance of this vaccine. Advised may receive this vaccine at local pharmacy or Health Dept. Aware to provide a copy of the vaccination record if obtained from local pharmacy or Health Dept. Verbalized acceptance and understanding.  Flu Vaccine status: Due, Education has been provided regarding the importance of this vaccine. Advised may receive this vaccine at local pharmacy or Health Dept. Aware to provide a copy of the vaccination record if obtained from local pharmacy or Health Dept. Verbalized acceptance and  understanding.  Pneumococcal vaccine status: Up to date  Covid-19 vaccine status: Completed vaccines  Qualifies for Shingles Vaccine? Yes   Zostavax completed No   Shingrix Completed?: No.    Education has been provided regarding the  importance of this vaccine. Patient has been advised to call insurance company to determine out of pocket expense if they have not yet received this vaccine. Advised may also receive vaccine at local pharmacy or Health Dept. Verbalized acceptance and understanding.  Screening Tests Health Maintenance  Topic Date Due   FOOT EXAM  Never done   TETANUS/TDAP  Never done   Zoster Vaccines- Shingrix (1 of 2) Never done   URINE MICROALBUMIN  08/28/2020   INFLUENZA VACCINE  01/18/2021   OPHTHALMOLOGY EXAM  01/29/2021   Hepatitis C Screening  03/05/2021 (Originally 01/02/1959)   COVID-19 Vaccine (4 - Booster for Pfizer series) 03/14/2021   HEMOGLOBIN A1C  06/18/2021   MAMMOGRAM  12/29/2021   DEXA SCAN  Completed   PNA vac Low Risk Adult  Completed   HPV VACCINES  Aged Out    Health Maintenance  Health Maintenance Due  Topic Date Due   FOOT EXAM  Never done   TETANUS/TDAP  Never done   Zoster Vaccines- Shingrix (1 of 2) Never done   URINE MICROALBUMIN  08/28/2020   INFLUENZA VACCINE  01/18/2021   OPHTHALMOLOGY EXAM  01/29/2021    Colorectal cancer screening: No longer required.   Mammogram status: Completed 12/29/2020. Repeat every year  Bone Density status: Completed 08/18/2008.   Lung Cancer Screening: (Low Dose CT Chest recommended if Age 70-80 years, 30 pack-year currently smoking OR have quit w/in 15years.) does not qualify.   Lung Cancer Screening Referral: no  Additional Screening:  Hepatitis C Screening: does qualify; due  Vision Screening: Recommended annual ophthalmology exams for early detection of glaucoma and other disorders of the eye. Is the patient up to date with their annual eye exam?  No  Who is the provider or what is the  name of the office in which the patient attends annual eye exams? [facility] If pt is not established with a provider, would they like to be referred to a provider to establish care? No .   Dental Screening: Recommended annual dental exams for proper oral hygiene  Community Resource Referral / Chronic Care Management: CRR required this visit?  No   CCM required this visit?  No      Plan:     I have personally reviewed and noted the following in the patient's chart:   Medical and social history Use of alcohol, tobacco or illicit drugs  Current medications and supplements including opioid prescriptions.  Functional ability and status Nutritional status Physical activity Advanced directives List of other physicians Hospitalizations, surgeries, and ER visits in previous 12 months Vitals Screenings to include cognitive, depression, and falls Referrals and appointments  In addition, I have reviewed and discussed with patient certain preventive protocols, quality metrics, and best practice recommendations. A written personalized care plan for preventive services as well as general preventive health recommendations were provided to patient.     Kellie Simmering, LPN   [id number]   Nurse Notes:

## 2021-03-11 ENCOUNTER — Other Ambulatory Visit: Payer: Self-pay | Admitting: Nurse Practitioner

## 2021-03-11 MED ORDER — AMLODIPINE BESYLATE 5 MG PO TABS: ORAL_TABLET | ORAL | 1 refills | Status: DC

## 2021-03-11 MED ORDER — ATORVASTATIN CALCIUM 20 MG PO TABS: 20.0000 mg | ORAL_TABLET | Freq: Every day | ORAL | 1 refills | Status: DC

## 2021-04-15 DIAGNOSIS — R7309 Other abnormal glucose: Secondary | ICD-10-CM | POA: Diagnosis not present

## 2021-04-15 DIAGNOSIS — H5203 Hypermetropia, bilateral: Secondary | ICD-10-CM | POA: Diagnosis not present

## 2021-04-15 DIAGNOSIS — H52223 Regular astigmatism, bilateral: Secondary | ICD-10-CM | POA: Diagnosis not present

## 2021-04-15 DIAGNOSIS — H524 Presbyopia: Secondary | ICD-10-CM | POA: Diagnosis not present

## 2021-04-15 DIAGNOSIS — H2513 Age-related nuclear cataract, bilateral: Secondary | ICD-10-CM | POA: Diagnosis not present

## 2021-04-15 LAB — HM DIABETES EYE EXAM

## 2021-05-28 ENCOUNTER — Ambulatory Visit (INDEPENDENT_AMBULATORY_CARE_PROVIDER_SITE_OTHER): Payer: Medicare HMO | Admitting: Nurse Practitioner

## 2021-05-28 ENCOUNTER — Encounter: Payer: Self-pay | Admitting: Nurse Practitioner

## 2021-05-28 ENCOUNTER — Other Ambulatory Visit: Payer: Self-pay

## 2021-05-28 VITALS — BP 130/81 | HR 96 | Wt 188.8 lb

## 2021-05-28 DIAGNOSIS — Z1159 Encounter for screening for other viral diseases: Secondary | ICD-10-CM | POA: Diagnosis not present

## 2021-05-28 DIAGNOSIS — E119 Type 2 diabetes mellitus without complications: Secondary | ICD-10-CM

## 2021-05-28 DIAGNOSIS — I10 Essential (primary) hypertension: Secondary | ICD-10-CM | POA: Diagnosis not present

## 2021-05-28 DIAGNOSIS — G3184 Mild cognitive impairment, so stated: Secondary | ICD-10-CM | POA: Diagnosis not present

## 2021-05-28 DIAGNOSIS — E785 Hyperlipidemia, unspecified: Secondary | ICD-10-CM | POA: Diagnosis not present

## 2021-05-28 MED ORDER — ATORVASTATIN CALCIUM 20 MG PO TABS: 20.0000 mg | ORAL_TABLET | Freq: Every day | ORAL | 1 refills | Status: DC

## 2021-05-28 MED ORDER — AMLODIPINE BESYLATE 5 MG PO TABS: ORAL_TABLET | ORAL | 1 refills | Status: DC

## 2021-05-28 NOTE — Assessment & Plan Note (Signed)
Chronic.  Controlled.  Continue with current medication regimen of Amlodipine 5mg  daily.  Refills sent today. Labs ordered today.  Return to clinic in 6 months for reevaluation.  Call sooner if concerns arise.

## 2021-05-28 NOTE — Progress Notes (Addendum)
BP 130/81   Pulse 96   Wt 188 lb 12.8 oz (85.6 kg)   SpO2 97%   BMI 30.02 kg/m    Subjective:    Patient ID: Carol Armstrong, female    DOB: 1941-05-07, 80 y.o.   MRN: [id number]  HPI: Carol Armstrong is a 80 y.o. female  Chief Complaint  Patient presents with   Hyperlipidemia   Hypertension   Diabetes    Patient states she feels good and everything is going well. Patient has lost 8 pounds since her last visit.    HYPERTENSION / HYPERLIPIDEMIA Satisfied with current treatment? no Duration of hypertension: years BP monitoring frequency: not checking BP range:  BP medication side effects: no Past BP meds: amlodipine Duration of hyperlipidemia: years Cholesterol medication side effects: no Cholesterol supplements: none Past cholesterol medications: atorvastain (lipitor) Medication compliance: excellent compliance Aspirin: no Recent stressors: no Recurrent headaches: no Visual changes: no Palpitations: no Dyspnea: no Chest pain: no Lower extremity edema: no Dizzy/lightheaded: no  DIABETES Hypoglycemic episodes:no Polydipsia/polyuria: no Visual disturbance: no Chest pain: no Paresthesias: no Glucose Monitoring: no  Accucheck frequency: Not Checking  Fasting glucose:  Post prandial:  Evening:  Before meals: Taking Insulin?: no  Long acting insulin:  Short acting insulin: Blood Pressure Monitoring: not checking Retinal Examination: Up to Date Foot Exam: Up to Date Diabetic Education: Not Completed Pneumovax: Up to Date Influenza: Up to Date Aspirin: no  Patient's granddaughter is concerned about patient's memory.  She is wondering if the cognitive exam  6CIT Screen 02/12/2021 02/10/2020  What Year? 0 points 0 points  What month? 0 points 0 points  What time? 3 points 0 points  Count back from 20 0 points 4 points  Months in reverse 2 points 2 points  Repeat phrase 8 points 4 points  Total Score 13 10      MMSE - Mini Mental State Exam 05/28/2021  09/17/2020  Orientation to time 5 5  Orientation to Place 5 5  Registration 1 3  Attention/ Calculation 0 0  Recall 2 2  Language- name 2 objects 2 2  Language- repeat 1 1  Language- follow 3 step command 3 3  Language- read & follow direction 1 1  Write a sentence 1 1  Copy design 0 0  Total score 21 23      Relevant past medical, surgical, family and social history reviewed and updated as indicated. Interim medical history since our last visit reviewed. Allergies and medications reviewed and updated.  Review of Systems  Eyes:  Negative for visual disturbance.  Respiratory:  Negative for cough, chest tightness and shortness of breath.   Cardiovascular:  Negative for chest pain, palpitations and leg swelling.  Endocrine: Negative for polydipsia and polyuria.  Neurological:  Negative for dizziness, light-headedness, numbness and headaches.   Per HPI unless specifically indicated above     Objective:    BP 130/81   Pulse 96   Wt 188 lb 12.8 oz (85.6 kg)   SpO2 97%   BMI 30.02 kg/m   Wt Readings from Last 3 Encounters:  05/28/21 188 lb 12.8 oz (85.6 kg)  02/12/21 196 lb (88.9 kg)  12/17/20 196 lb 3.2 oz (89 kg)    Physical Exam Vitals and nursing note reviewed.  Constitutional:      General: She is not in acute distress.    Appearance: Normal appearance. She is normal weight. She is not ill-appearing, toxic-appearing or diaphoretic.  HENT:  Head: Normocephalic.     Right Ear: External ear normal.     Left Ear: External ear normal.     Nose: Nose normal.     Mouth/Throat:     Mouth: Mucous membranes are moist.     Pharynx: Oropharynx is clear.  Eyes:     General:        Right eye: No discharge.        Left eye: No discharge.     Extraocular Movements: Extraocular movements intact.     Conjunctiva/sclera: Conjunctivae normal.     Pupils: Pupils are equal, round, and reactive to light.  Cardiovascular:     Rate and Rhythm: Normal rate and regular rhythm.      Heart sounds: No murmur heard. Pulmonary:     Effort: Pulmonary effort is normal. No respiratory distress.     Breath sounds: Normal breath sounds. No wheezing or rales.  Musculoskeletal:     Cervical back: Normal range of motion and neck supple.  Skin:    General: Skin is warm and dry.     Capillary Refill: Capillary refill takes less than 2 seconds.  Neurological:     General: No focal deficit present.     Mental Status: She is alert and oriented to person, place, and time. Mental status is at baseline.  Psychiatric:        Mood and Affect: Mood normal.        Behavior: Behavior normal.        Thought Content: Thought content normal.        Judgment: Judgment normal.    Results for orders placed or performed in visit on 04/20/21  HM DIABETES EYE EXAM  Result Value Ref Range   HM Diabetic Eye Exam No Retinopathy No Retinopathy      Assessment & Plan:   Problem List Items Addressed This Visit       Cardiovascular and Mediastinum   Essential hypertension - Primary    Chronic.  Controlled.  Continue with current medication regimen of Amlodipine $RemoveBefor'5mg'PewfoGyehjjR$  daily.  Refills sent today. Labs ordered today.  Return to clinic in 6 months for reevaluation.  Call sooner if concerns arise.        Relevant Medications   amLODipine (NORVASC) 5 MG tablet   atorvastatin (LIPITOR) 20 MG tablet   Other Relevant Orders   Comp Met (CMET)     Endocrine   Diabetes mellitus type 2, uncomplicated (HCC)    Chronic.  Controlled without medication.  Labs ordered today.  Return to clinic in 6 months for reevaluation.  Call sooner if concerns arise.        Relevant Medications   atorvastatin (LIPITOR) 20 MG tablet   Other Relevant Orders   HgB A1c   Microalbumin, Urine Waived     Other   Hyperlipidemia    Chronic.  Controlled.  Continue with current medication regimen on Atorvastatin $RemoveBeforeD'20mg'[id number]$  daily.  Labs ordered today.  Return to clinic in 6 months for reevaluation.  Call sooner if concerns arise.         Relevant Medications   amLODipine (NORVASC) 5 MG tablet   atorvastatin (LIPITOR) 20 MG tablet   Other Relevant Orders   Lipid Profile   Mild cognitive impairment    MMSE done in office today with a score of 21. Recent 6CIT showed concerns as well.  Patient would like to work on her memory puzzles for the next 6 months. She will consider medication at next  visit if score is still similar or worse.        Other Visit Diagnoses     Encounter for hepatitis C screening test for low risk patient       Relevant Orders   Hepatitis C Antibody        Follow up plan: Return in about 6 months (around 11/26/2021) for HTN, HLD, DM2 FU and MMSE repeat.

## 2021-05-28 NOTE — Assessment & Plan Note (Signed)
Chronic.  Controlled.  Continue with current medication regimen on Atorvastatin 20mg daily.  Labs ordered today.  Return to clinic in 6 months for reevaluation.  Call sooner if concerns arise.   

## 2021-05-28 NOTE — Assessment & Plan Note (Signed)
MMSE done in office today with a score of 21. Recent 6CIT showed concerns as well.  Patient would like to work on her memory puzzles for the next 6 months. She will consider medication at next visit if score is still similar or worse.

## 2021-05-28 NOTE — Assessment & Plan Note (Signed)
Chronic.  Controlled without medication..  Labs ordered today.  Return to clinic in 6 months for reevaluation.  Call sooner if concerns arise.  ° °

## 2021-05-29 LAB — COMPREHENSIVE METABOLIC PANEL
ALT: 26 IU/L (ref 0–32)
AST: 26 IU/L (ref 0–40)
Albumin/Globulin Ratio: 1.6 (ref 1.2–2.2)
Albumin: 4.4 g/dL (ref 3.7–4.7)
Alkaline Phosphatase: 63 IU/L (ref 44–121)
BUN/Creatinine Ratio: 18 (ref 12–28)
BUN: 15 mg/dL (ref 8–27)
Bilirubin Total: 0.9 mg/dL (ref 0.0–1.2)
CO2: 27 mmol/L (ref 20–29)
Calcium: 9.7 mg/dL (ref 8.7–10.3)
Chloride: 104 mmol/L (ref 96–106)
Creatinine, Ser: 0.82 mg/dL (ref 0.57–1.00)
Globulin, Total: 2.7 g/dL (ref 1.5–4.5)
Glucose: 129 mg/dL — ABNORMAL HIGH (ref 70–99)
Potassium: 4.5 mmol/L (ref 3.5–5.2)
Sodium: 142 mmol/L (ref 134–144)
Total Protein: 7.1 g/dL (ref 6.0–8.5)
eGFR: 73 mL/min/{1.73_m2} (ref >59)

## 2021-05-29 LAB — HEMOGLOBIN A1C
Est. average glucose Bld gHb Est-mCnc: 134 mg/dL
Hgb A1c MFr Bld: 6.3 % — ABNORMAL HIGH (ref 4.8–5.6)

## 2021-05-29 LAB — LIPID PANEL
Chol/HDL Ratio: 2.1 ratio (ref 0.0–4.4)
Cholesterol, Total: 139 mg/dL (ref 100–199)
HDL: 65 mg/dL (ref >39)
LDL Chol Calc (NIH): 60 mg/dL (ref 0–99)
Triglycerides: 70 mg/dL (ref 0–149)
VLDL Cholesterol Cal: 14 mg/dL (ref 5–40)

## 2021-05-29 LAB — HEPATITIS C ANTIBODY: Hep C Virus Ab: <0.1 s/co ratio (ref 0.0–0.9)

## 2021-05-31 NOTE — Progress Notes (Signed)
Please let patient know that her lab work looks goods.  No concerns with cholesterol.  Her A1c remains well controlled at 6.3.  Please let me know if she has any questions.

## 2021-06-03 ENCOUNTER — Ambulatory Visit: Payer: Medicare Other | Admitting: Nurse Practitioner

## 2021-07-01 ENCOUNTER — Telehealth: Payer: Self-pay | Admitting: Nurse Practitioner

## 2021-07-01 NOTE — Telephone Encounter (Signed)
Copied from [place] [id number]. Topic: Quick Communication - Rx Refill/Question >> Jul 01, 2021  4:10 PM Diane, Hanel wrote: Medication: atorvastatin (LIPITOR) 20 MG tablet 90 tablet 1 05/28/2021   Sig - Route: Take 1 tablet (20 mg total) by mouth daily. - Oral  Sent to pharmacy as: atorvastatin (LIPITOR) 20 MG tablet  E-Prescribing Status: Receipt confirme     Has the patient contacted their pharmacy? Yes.   (Agent: If no, request that the patient contact the pharmacy for the refill. If patient does not wish to contact the pharmacy document the reason why and proceed with request.) Pt is calling but only has 1 pill left and wants a few till arrives by mail  (Agent: If yes, when and what did the pharmacy advise?) mail order  Preferred Pharmacy (with phone number or street name): [facility] [place], [place] - [address] [place] [address] Phone: [phone] Fax: [phone] Hours: Not open 24 ho   Has the patient been seen for an appointment in the last year OR does the patient have an upcoming appointment? Yes.    Agent: Please be advised that RX refills may take up to 3 business days. We ask that you follow-up with your pharmacy.

## 2021-07-02 MED ORDER — ATORVASTATIN CALCIUM 20 MG PO TABS: 20.0000 mg | ORAL_TABLET | Freq: Every day | ORAL | 1 refills | Status: DC

## 2021-07-02 NOTE — Telephone Encounter (Signed)
Medication sent to the pharmacy.

## 2021-07-07 ENCOUNTER — Other Ambulatory Visit: Payer: Self-pay | Admitting: Nurse Practitioner

## 2021-11-11 DIAGNOSIS — E785 Hyperlipidemia, unspecified: Secondary | ICD-10-CM | POA: Diagnosis not present

## 2021-11-11 DIAGNOSIS — Z833 Family history of diabetes mellitus: Secondary | ICD-10-CM | POA: Diagnosis not present

## 2021-11-11 DIAGNOSIS — I10 Essential (primary) hypertension: Secondary | ICD-10-CM | POA: Diagnosis not present

## 2021-11-11 DIAGNOSIS — Z008 Encounter for other general examination: Secondary | ICD-10-CM | POA: Diagnosis not present

## 2021-11-11 DIAGNOSIS — Z8249 Family history of ischemic heart disease and other diseases of the circulatory system: Secondary | ICD-10-CM | POA: Diagnosis not present

## 2021-11-11 DIAGNOSIS — E119 Type 2 diabetes mellitus without complications: Secondary | ICD-10-CM | POA: Diagnosis not present

## 2021-11-26 ENCOUNTER — Ambulatory Visit: Payer: Medicare HMO | Admitting: Nurse Practitioner

## 2021-11-30 ENCOUNTER — Ambulatory Visit: Payer: Self-pay | Admitting: *Deleted

## 2021-11-30 NOTE — Telephone Encounter (Signed)
  Chief Complaint: cough Symptoms: productive Frequency: did not cough while on phone Pertinent Negatives: Patient denies fever Disposition: '[]'$ ED /'[]'$ Urgent Care (no appt availability in office) / '[]'$ Appointment(In office/virtual)/ '[x]'$  [facility]/ '[]'$ Home Care/ '[]'$ Refused Recommended Disposition /'[]'$ [facility]/ '[x]'$  Follow-up with PCP Additional Notes: Pt's daughter called and we were unable to reach her. Pt has a cough, productive brown for one week, tried OTC meds. Requesting rx, has an upcoming appt. I was going to move to earlier appt but need to speak with daughter who will drive. Unsure of pt's mental capacity. Awaiting call back from daughter. While waiting on appt 12/08/21 pt requesting a rx for cough.   Reason for Disposition  Cough  Answer Assessment - Initial Assessment Questions 1. ONSET: "When did the cough begin?"      1 week 2. SEVERITY: "How bad is the cough today?"      unsure 3. SPUTUM: "Describe the color of your sputum" (none, dry cough; clear, white, yellow, green)     Light brown 4. HEMOPTYSIS: "Are you coughing up any blood?" If so ask: "How much?" (flecks, streaks, tablespoons, etc.)     no 5. DIFFICULTY BREATHING: "Are you having difficulty breathing?" If Yes, ask: "How bad is it?" (e.g., mild, moderate, severe)    - MILD: No SOB at rest, mild SOB with walking, speaks normally in sentences, can lie down, no retractions, pulse < 100.    - MODERATE: SOB at rest, SOB with minimal exertion and prefers to sit, cannot lie down flat, speaks in phrases, mild retractions, audible wheezing, pulse 100-120.    - SEVERE: Very SOB at rest, speaks in single words, struggling to breathe, sitting hunched forward, retractions, pulse > 120      no 6. FEVER: "Do you have a fever?" If Yes, ask: "What is your temperature, how was it measured, and when did it start?"     no 7. CARDIAC HISTORY: "Do you have any history of heart disease?" (e.g., heart attack, congestive  heart failure)      no 8. LUNG HISTORY: "Do you have any history of lung disease?"  (e.g., pulmonary embolus, asthma, emphysema)     no 9. PE RISK FACTORS: "Do you have a history of blood clots?" (or: recent major surgery, recent prolonged travel, bedridden)     no 10. OTHER SYMPTOMS: "Do you have any other symptoms?" (e.g., runny nose, wheezing, chest pain)       no 11. PREGNANCY: "Is there any chance you are pregnant?" "When was your last menstrual period?"       na 12. TRAVEL: "Have you traveled out of the country in the last month?" (e.g., travel history, exposures)       na  Protocols used: Cough - Acute Productive-A-AH

## 2021-11-30 NOTE — Telephone Encounter (Signed)
Summary: discuss cough and compression socks   Caller calling on a three way call w/ patient, caller states patient was informed at a [facility] that she needs compression socks due to poor leg circulation and inquiring what size compression socks patient should get   Patient states she has has a cough x3d and requesting advise on how to treat it, Patient verbally agreed for caller, Helene Kelp to be contacted on her behalf and Helene Kelp states she will call patient on 3 way     Phone just plays music and then cuts off. Tried twice.

## 2021-12-01 ENCOUNTER — Other Ambulatory Visit: Payer: Self-pay | Admitting: Nurse Practitioner

## 2021-12-01 DIAGNOSIS — Z1231 Encounter for screening mammogram for malignant neoplasm of breast: Secondary | ICD-10-CM

## 2021-12-01 NOTE — Telephone Encounter (Signed)
Patient states she will just wait until her appointment on 6/21

## 2021-12-01 NOTE — Telephone Encounter (Signed)
Patient will need an appt

## 2021-12-06 ENCOUNTER — Other Ambulatory Visit: Payer: Self-pay | Admitting: Nurse Practitioner

## 2021-12-06 NOTE — Telephone Encounter (Signed)
Requested Prescriptions  Pending Prescriptions Disp Refills  . atorvastatin (LIPITOR) 20 MG tablet [Pharmacy Med Name: ATORVASTATIN '20MG'$  TABLETS] 90 tablet 0    Sig: TAKE 1 TABLET(20 MG) BY MOUTH DAILY     Cardiovascular:  Antilipid - Statins Failed - 12/06/2021  7:22 AM      Failed - Lipid Panel in normal range within the last 12 months    Cholesterol, Total  Date Value Ref Range Status  05/28/2021 139 100 - 199 mg/dL Final   LDL Chol Calc (NIH)  Date Value Ref Range Status  05/28/2021 60 0 - 99 mg/dL Final   HDL  Date Value Ref Range Status  05/28/2021 65 >39 mg/dL Final   Triglycerides  Date Value Ref Range Status  05/28/2021 70 0 - 149 mg/dL Final         Passed - Patient is not pregnant      Passed - Valid encounter within last 12 months    Recent Outpatient Visits          6 months ago Essential hypertension   [place], Karen, NP   11 months ago Annual physical exam   [facility] Jon Billings, NP   1 year ago Essential hypertension   [place], Lauren A, NP   1 year ago Flu vaccine need   [facility] Kathrine Haddock, NP   2 years ago Essential hypertension   [facility] Volney American, [place]      Future Appointments            In 3 weeks Jon Billings, NP [place], [place]   In 2 months  [place], [facility]           . amLODipine ([place]) 5 MG tablet [Pharmacy Med Name: AMLODIPINE BESYLATE '5MG'$  TABLETS] 90 tablet 0    Sig: TAKE 1 TABLET(5 MG) BY MOUTH DAILY     Cardiovascular: Calcium Channel Blockers 2 Failed - 12/06/2021  7:22 AM      Failed - Valid encounter within last 6 months    Recent Outpatient Visits          6 months ago Essential hypertension   [facility] Jon Billings, NP   11 months ago Annual physical exam   [facility] Jon Billings, NP   1 year ago Essential hypertension    [place], Lauren A, NP   1 year ago Flu vaccine need   [facility] Kathrine Haddock, NP   2 years ago Essential hypertension   [facility] Volney American, [place]      Future Appointments            In 3 weeks Jon Billings, NP [facility], [facility]   In 2 months  [facility] [employer], [facility]           Passed - Last BP in normal range    BP Readings from Last 1 Encounters:  05/28/21 130/81         Passed - Last Heart Rate in normal range    Pulse Readings from Last 1 Encounters:  05/28/21 96

## 2021-12-08 ENCOUNTER — Ambulatory Visit: Payer: Medicare HMO | Admitting: Nurse Practitioner

## 2021-12-27 ENCOUNTER — Ambulatory Visit
Admission: RE | Admit: 2021-12-27 | Discharge: 2021-12-27 | Disposition: A | Discharge status: Home / Self Care | Payer: Medicare HMO | Source: Ambulatory Visit | Attending: Nurse Practitioner | Admitting: Nurse Practitioner

## 2021-12-27 ENCOUNTER — Encounter: Payer: Self-pay | Admitting: Nurse Practitioner

## 2021-12-27 ENCOUNTER — Ambulatory Visit (INDEPENDENT_AMBULATORY_CARE_PROVIDER_SITE_OTHER): Payer: Medicare HMO | Admitting: Nurse Practitioner

## 2021-12-27 VITALS — BP 153/79 | HR 81 | Temp 98.5°F | Wt 186.6 lb

## 2021-12-27 DIAGNOSIS — I739 Peripheral vascular disease, unspecified: Secondary | ICD-10-CM | POA: Diagnosis not present

## 2021-12-27 DIAGNOSIS — E7800 Pure hypercholesterolemia, unspecified: Secondary | ICD-10-CM | POA: Diagnosis not present

## 2021-12-27 DIAGNOSIS — M25562 Pain in left knee: Secondary | ICD-10-CM | POA: Diagnosis not present

## 2021-12-27 DIAGNOSIS — I10 Essential (primary) hypertension: Secondary | ICD-10-CM

## 2021-12-27 DIAGNOSIS — G3184 Mild cognitive impairment, so stated: Secondary | ICD-10-CM

## 2021-12-27 DIAGNOSIS — E119 Type 2 diabetes mellitus without complications: Secondary | ICD-10-CM | POA: Diagnosis not present

## 2021-12-27 MED ORDER — AMLODIPINE BESYLATE 5 MG PO TABS: ORAL_TABLET | ORAL | 1 refills | Status: DC

## 2021-12-27 MED ORDER — ATORVASTATIN CALCIUM 20 MG PO TABS: ORAL_TABLET | ORAL | 1 refills | Status: DC

## 2021-12-27 NOTE — Assessment & Plan Note (Signed)
Chronic.  Controlled.  Continue with current medication regimen.  Labs ordered today.  Return to clinic in 6 months for reevaluation.  Call sooner if concerns arise.  ? ?

## 2021-12-27 NOTE — Assessment & Plan Note (Signed)
Chronic. Has worsened some since last visit.  Patient slower to recall answers.  Will send to Neurology for evaluation and treatment.  Patient agrees to referral.  Follow up in 3 months for reevaluation.  Call sooner if concerns arise.

## 2021-12-27 NOTE — Assessment & Plan Note (Signed)
New diagnosis.  Assessed at home via insurance.  Discussed treatment with patient during visit today.

## 2021-12-27 NOTE — Progress Notes (Signed)
BP (!) 153/79   Pulse 81   Temp 98.5 F (36.9 C) (Oral)   Wt 186 lb 9.6 oz (84.6 kg)   SpO2 96%   BMI 29.67 kg/m    Subjective:    Patient ID: Carol Armstrong, female    DOB: Oct 26, 1940, 81 y.o.   MRN: [id number]  HPI: Carol Armstrong is a 81 y.o. female  Chief Complaint  Patient presents with   Hyperlipidemia   Diabetes   HYPERTENSION / [place] Satisfied with current treatment? no Duration of hypertension: years BP monitoring frequency: not checking BP range:  BP medication side effects: no Past BP meds: amlodipine Duration of hyperlipidemia: years Cholesterol medication side effects: no Cholesterol supplements: none Past cholesterol medications: atorvastain (lipitor) Medication compliance: excellent compliance Aspirin: no Recent stressors: no Recurrent headaches: no Visual changes: no Palpitations: no Dyspnea: no Chest pain: no Lower extremity edema: no Dizzy/lightheaded: no  DIABETES Hypoglycemic episodes:no Polydipsia/polyuria: no Visual disturbance: no Chest pain: no Paresthesias: no Glucose Monitoring: no  Accucheck frequency: Not Checking  Fasting glucose:  Post prandial:  Evening:  Before meals: Taking Insulin?: no  Long acting insulin:  Short acting insulin: Blood Pressure Monitoring: not checking Retinal Examination: Up to Date Foot Exam: Up to Date Diabetic Education: Not Completed Pneumovax: Up to Date Influenza: Up to Date Aspirin: no  Patient state she has been having some left thigh pain.  The pain comes and goes.  She states on Saturday she had a flare.  She feels like she drinks a good amount of water.  She usually takes ibuprofen for the pain.  States it helped some but then the pain came back.  Then she woke up on $Rem"Sunday and the pain was gone.  States she usually takes 1 take of ibuprofen.  Patient's granddaughter states her leg has actually been bothering her for a couple of weeks.  States she has been needing some help  getting up from the chair some.  Granddaughter also states it is more than just her thigh bothering her.  She is having some swelling in her left ankle.  Did have a PAD test at home which showed some decrease in flow.    MEMORYRamona Armstrong- [phone] Patient feels like her memory has improved from last time.  She is doing a lot of word searches and reading a lot.   Patient's granddaughter is concerned about patient's memory.  She is wondering if the cognitive exam     02/12/2021    1:53 PM 02/10/2020   11:25 AM  6CIT Screen  What Year? 0 points 0 points  What month? 0 points 0 points  What time? 3 points 0 points  Count back from 20 0 points 4 points  Months in reverse 2 points 2 points  Repeat phrase 8 points 4 points  Total Score 13 points 10 points         12"rdAz$ /02/2021   11:38 AM 09/17/2020    2:56 PM  MMSE - Mini Mental State Exam  Orientation to time 5 5  Orientation to Place 5 5  Registration 1 3  Attention/ Calculation 0 0  Recall 2 2  Language- name 2 objects 2 2  Language- repeat 1 1  Language- follow 3 step command 3 3  Language- read & follow direction 1 1  Write a sentence 1 1  Copy design 0 0  Total score 21 23      Relevant past medical, surgical, family and social history reviewed  and updated as indicated. Interim medical history since our last visit reviewed. Allergies and medications reviewed and updated.  Review of Systems  Eyes:  Negative for visual disturbance.  Respiratory:  Negative for cough, chest tightness and shortness of breath.   Cardiovascular:  Negative for chest pain, palpitations and leg swelling.  Endocrine: Negative for polydipsia and polyuria.  Musculoskeletal:        Left thigh and knee pain  Neurological:  Negative for dizziness, light-headedness, numbness and headaches.    Per HPI unless specifically indicated above     Objective:    BP (!) 153/79   Pulse 81   Temp 98.5 F (36.9 C) (Oral)   Wt 186 lb 9.6 oz (84.6 kg)    SpO2 96%   BMI 29.67 kg/m   Wt Readings from Last 3 Encounters:  12/27/21 186 lb 9.6 oz (84.6 kg)  05/28/21 188 lb 12.8 oz (85.6 kg)  02/12/21 196 lb (88.9 kg)    Physical Exam Vitals and nursing note reviewed.  Constitutional:      General: She is not in acute distress.    Appearance: Normal appearance. She is normal weight. She is not ill-appearing, toxic-appearing or diaphoretic.  HENT:     Head: Normocephalic.     Right Ear: External ear normal.     Left Ear: External ear normal.     Nose: Nose normal.     Mouth/Throat:     Mouth: Mucous membranes are moist.     Pharynx: Oropharynx is clear.  Eyes:     General:        Right eye: No discharge.        Left eye: No discharge.     Extraocular Movements: Extraocular movements intact.     Conjunctiva/sclera: Conjunctivae normal.     Pupils: Pupils are equal, round, and reactive to light.  Cardiovascular:     Rate and Rhythm: Normal rate and regular rhythm.     Heart sounds: No murmur heard. Pulmonary:     Effort: Pulmonary effort is normal. No respiratory distress.     Breath sounds: Normal breath sounds. No wheezing or rales.  Musculoskeletal:     Cervical back: Normal range of motion and neck supple.     Right lower leg: No edema.     Left lower leg: Edema present.  Skin:    General: Skin is warm and dry.     Capillary Refill: Capillary refill takes less than 2 seconds.  Neurological:     General: No focal deficit present.     Mental Status: She is alert and oriented to person, place, and time. Mental status is at baseline.  Psychiatric:        Mood and Affect: Mood normal.        Behavior: Behavior normal.        Thought Content: Thought content normal.        Judgment: Judgment normal.     Results for orders placed or performed in visit on 05/28/21  Lipid Profile  Result Value Ref Range   Cholesterol, Total 139 100 - 199 mg/dL   Triglycerides 70 0 - 149 mg/dL   HDL 65 >39 mg/dL   VLDL Cholesterol Cal 14 5  - 40 mg/dL   LDL Chol Calc (NIH) 60 0 - 99 mg/dL   Chol/HDL Ratio 2.1 0.0 - 4.4 ratio  Comp Met (CMET)  Result Value Ref Range   Glucose 129 (H) 70 - 99 mg/dL   BUN  15 8 - 27 mg/dL   Creatinine, Ser 0.82 0.57 - 1.00 mg/dL   eGFR 73 >59 mL/min/1.73   BUN/Creatinine Ratio 18 12 - 28   Sodium 142 134 - 144 mmol/L   Potassium 4.5 3.5 - 5.2 mmol/L   Chloride 104 96 - 106 mmol/L   CO2 27 20 - 29 mmol/L   Calcium 9.7 8.7 - 10.3 mg/dL   Total Protein 7.1 6.0 - 8.5 g/dL   Albumin 4.4 3.7 - 4.7 g/dL   Globulin, Total 2.7 1.5 - 4.5 g/dL   Albumin/Globulin Ratio 1.6 1.2 - 2.2   Bilirubin Total 0.9 0.0 - 1.2 mg/dL   Alkaline Phosphatase 63 44 - 121 IU/L   AST 26 0 - 40 IU/L   ALT 26 0 - 32 IU/L  HgB A1c  Result Value Ref Range   Hgb A1c MFr Bld 6.3 (H) 4.8 - 5.6 %   Est. average glucose Bld gHb Est-mCnc 134 mg/dL  Hepatitis C Antibody  Result Value Ref Range   Hep C Virus Ab <0.1 0.0 - 0.9 s/co ratio      Assessment & Plan:   Problem List Items Addressed This Visit       Cardiovascular and Mediastinum   Essential hypertension    Chronic.  Controlled.  Continue with current medication regimen.  Labs ordered today.  Return to clinic in 6 months for reevaluation.  Call sooner if concerns arise.        Relevant Medications   amLODipine (NORVASC) 5 MG tablet   atorvastatin (LIPITOR) 20 MG tablet   Other Relevant Orders   Comp Met (CMET)   PAD (peripheral artery disease) ([place])    New diagnosis.  Assessed at home via insurance.  Discussed treatment with patient during visit today.       Relevant Medications   amLODipine (NORVASC) 5 MG tablet   atorvastatin (LIPITOR) 20 MG tablet     Endocrine   Diabetes mellitus type 2, uncomplicated ([place]) - Primary    Chronic.  Controlled.  Continue with current medication regimen.  Labs ordered today.  Return to clinic in 6 months for reevaluation.  Call sooner if concerns arise.        Relevant Medications   atorvastatin (LIPITOR) 20 MG  tablet   Other Relevant Orders   HgB A1c     Nervous and Auditory   Mild cognitive impairment    Chronic. Has worsened some since last visit.  Patient slower to recall answers.  Will send to Neurology for evaluation and treatment.  Patient agrees to referral.  Follow up in 3 months for reevaluation.  Call sooner if concerns arise.       Relevant Orders   Ambulatory referral to Neurology     Other   Hypercholesteremia   Relevant Medications   amLODipine (NORVASC) 5 MG tablet   atorvastatin (LIPITOR) 20 MG tablet   Other Relevant Orders   Lipid Profile   Other Visit Diagnoses     Acute pain of left knee       Ongoing x a couple of weeks. Decrease in strength.  Suspect arthritis. Will start with Xray. Can do PT to help with strength. Will await imaging results.   Relevant Orders   DG Knee Complete 4 Views Left        Follow up plan: Return in about 3 months (around 03/29/2022) for Memory and Blood pressure.

## 2021-12-28 LAB — COMPREHENSIVE METABOLIC PANEL
ALT: 19 IU/L (ref 0–32)
AST: 25 IU/L (ref 0–40)
Albumin/Globulin Ratio: 1.7 (ref 1.2–2.2)
Albumin: 4.3 g/dL (ref 3.8–4.8)
Alkaline Phosphatase: 62 IU/L (ref 44–121)
BUN/Creatinine Ratio: 14 (ref 12–28)
BUN: 12 mg/dL (ref 8–27)
Bilirubin Total: 0.9 mg/dL (ref 0.0–1.2)
CO2: 25 mmol/L (ref 20–29)
Calcium: 9.1 mg/dL (ref 8.7–10.3)
Chloride: 105 mmol/L (ref 96–106)
Creatinine, Ser: 0.87 mg/dL (ref 0.57–1.00)
Globulin, Total: 2.5 g/dL (ref 1.5–4.5)
Glucose: 132 mg/dL — ABNORMAL HIGH (ref 70–99)
Potassium: 3.8 mmol/L (ref 3.5–5.2)
Sodium: 144 mmol/L (ref 134–144)
Total Protein: 6.8 g/dL (ref 6.0–8.5)
eGFR: 68 mL/min/{1.73_m2} (ref >59)

## 2021-12-28 LAB — LIPID PANEL
Chol/HDL Ratio: 2.2 ratio (ref 0.0–4.4)
Cholesterol, Total: 138 mg/dL (ref 100–199)
HDL: 62 mg/dL (ref >39)
LDL Chol Calc (NIH): 62 mg/dL (ref 0–99)
Triglycerides: 72 mg/dL (ref 0–149)
VLDL Cholesterol Cal: 14 mg/dL (ref 5–40)

## 2021-12-28 LAB — HEMOGLOBIN A1C
Est. average glucose Bld gHb Est-mCnc: 128 mg/dL
Hgb A1c MFr Bld: 6.1 % — ABNORMAL HIGH (ref 4.8–5.6)

## 2021-12-28 NOTE — Progress Notes (Signed)
Please let patient know that her lab work looks great.  A1c improved to 6.1 and is well controlled.  No concerns at this time.  Continue with current medication regimen.  Follow up as discussed.

## 2021-12-29 NOTE — Progress Notes (Signed)
Please let patient know that her xray shows that she has moderate to sever arthritis in her knee.  I recommend she do physical therapy as discussed during the visit. If she agrees, I will place the order.  Also, I recommend she see Orthopedics to see if there is something that can be done for her knee pain.  If she agrees, I will place both of those orders.

## 2021-12-30 ENCOUNTER — Inpatient Hospital Stay: Admission: RE | Admit: 2021-12-30 | Payer: Medicare Other | Source: Ambulatory Visit

## 2021-12-30 NOTE — Addendum Note (Signed)
Addended by: Jon Billings on: 12/30/2021 08:10 AM   Modules accepted: Orders

## 2021-12-30 NOTE — Progress Notes (Signed)
Orders placed.

## 2022-01-03 DIAGNOSIS — M1712 Unilateral primary osteoarthritis, left knee: Secondary | ICD-10-CM | POA: Diagnosis not present

## 2022-01-12 DIAGNOSIS — M25562 Pain in left knee: Secondary | ICD-10-CM | POA: Diagnosis not present

## 2022-01-18 ENCOUNTER — Ambulatory Visit
Admission: RE | Admit: 2022-01-18 | Discharge: 2022-01-18 | Disposition: A | Discharge status: Home / Self Care | Payer: Medicare HMO | Source: Ambulatory Visit | Attending: Nurse Practitioner | Admitting: Nurse Practitioner

## 2022-01-18 DIAGNOSIS — Z1231 Encounter for screening mammogram for malignant neoplasm of breast: Secondary | ICD-10-CM | POA: Insufficient documentation

## 2022-01-19 NOTE — Progress Notes (Signed)
Please let patient know her Mammogram did not show any evidence of a malignancy.  The recommendation is to repeat the Mammogram in 1 year.  

## 2022-01-31 DIAGNOSIS — M1712 Unilateral primary osteoarthritis, left knee: Secondary | ICD-10-CM | POA: Diagnosis not present

## 2022-02-09 ENCOUNTER — Telehealth: Payer: Self-pay

## 2022-02-09 NOTE — Telephone Encounter (Signed)
Called patient to inform her of Disability Parking Placard ready for pick up at her earliest convenience. Pt verbalized understanding.

## 2022-02-14 ENCOUNTER — Ambulatory Visit: Payer: Medicare HMO

## 2022-02-14 ENCOUNTER — Ambulatory Visit (INDEPENDENT_AMBULATORY_CARE_PROVIDER_SITE_OTHER): Payer: Medicare HMO | Admitting: *Deleted

## 2022-02-14 DIAGNOSIS — Z0000 Encounter for general adult medical examination without abnormal findings: Secondary | ICD-10-CM | POA: Diagnosis not present

## 2022-02-14 NOTE — Progress Notes (Signed)
Subjective:   Carol Armstrong is a 81 y.o. female who presents for Medicare Annual (Subsequent) preventive examination.  I connected with  Elray Buba on 02/14/22 by a telephone enabled telemedicine application and verified that I am speaking with the correct person using two identifiers.   I discussed the limitations of evaluation and management by telemedicine. The patient expressed understanding and agreed to proceed.  Patient location: home  Provider location: Tele-Health-home    Review of Systems     Cardiac Risk Factors include: advanced age (>8mn, >>45women);diabetes mellitus;hypertension;obesity (BMI >30kg/m2)     Objective:    Today's Vitals   There is no height or weight on file to calculate BMI.     02/14/2022   12:36 PM 02/12/2021    1:50 PM 02/10/2020   11:21 AM 10/07/2019    8:40 AM  Advanced Directives  Does Patient Have a Medical Advance Directive? Yes No No No  Type of Advance Directive [place] Chart? Yes - validated most recent copy scanned in chart (See row information)     Would patient like information on creating a medical advance directive? No - Patient declined   No - Patient declined    Current Medications (verified) Outpatient Encounter Medications as of 02/14/2022  Medication Sig   amLODipine (NORVASC) 5 MG tablet Take 1 tablet by mouth daily   atorvastatin (LIPITOR) 20 MG tablet TAKE 1 TABLET(20 MG) BY MOUTH DAILY   Multiple Vitamin (MULTIVITAMIN) capsule Take by mouth.   SODIUM FLUORIDE 5000 PLUS 1.1 % CREA dental cream Take by mouth.   No facility-administered encounter medications on file as of 02/14/2022.    Allergies (verified) Latex   History: Past Medical History:  Diagnosis Date   Hypertension    Prediabetes    Wears dentures    partial upper and lower   Past Surgical History:  Procedure Laterality Date   COLONOSCOPY     COLONOSCOPY WITH PROPOFOL N/A  10/07/2019   Procedure: COLONOSCOPY WITH PROPOFOL;  Surgeon: WLucilla Lame MD;  [facility] [facility]  Service: Endoscopy;  Laterality: N/A;  Latex  priority 4   POLYPECTOMY  10/07/2019   Procedure: POLYPECTOMY;  Surgeon: WLucilla Lame MD;  [place] [facility]  Service: Endoscopy;;   TUBAL LIGATION     Family History  Problem Relation Age of Onset   Hypertension Mother    Prostate cancer Father    Hypertension Sister    Hypertension Brother    Hypertension Sister    Hypertension Sister    Hypertension Sister    Breast cancer Neg Hx    Social History   Socioeconomic History   Marital status: Divorced    Spouse name: Not on file   Number of children: Not on file   Years of education: Not on file   Highest education level: Not on file  Occupational History   Occupation: retired  Tobacco Use   Smoking status: Former   Smokeless tobacco: Never   Tobacco comments:    as teenager  VScientific laboratory technicianUse: Never used  Substance and Sexual Activity   Alcohol use: Not Currently    Comment: occasinally wine   Drug use: Never   Sexual activity: Not Currently  Other Topics Concern   Not on file  Social History Narrative   Not on file   Social Determinants of Health   Financial Resource Strain:  Low Risk  (02/14/2022)   Overall Financial Resource Strain (CARDIA)    Difficulty of Paying Living Expenses: Not hard at all  Food Insecurity: No Food Insecurity (02/14/2022)   Hunger Vital Sign    Worried About Running Out of Food in the Last Year: Never true    Ran Out of Food in the Last Year: Never true  Transportation Needs: No Transportation Needs (02/14/2022)   PRAPARE - Hydrologist (Medical): No    Lack of Transportation (Non-Medical): No  Physical Activity: Inactive (02/14/2022)   Exercise Vital Sign    Days of Exercise per Week: 0 days    Minutes of Exercise per Session: 0 min  Stress: No Stress Concern Present (02/14/2022)    [place]    Feeling of Stress : Not at all  Social Connections: Moderately Isolated (02/14/2022)   Social Connection and Isolation Panel [NHANES]    Frequency of Communication with Friends and Family: More than three times a week    Frequency of Social Gatherings with Friends and Family: Twice a week    Attends Religious Services: More than 4 times per year    Active Member of [employer] or Organizations: No    Attends Archivist Meetings: Never    Marital Status: Widowed    Tobacco Counseling Counseling given: Not Answered Tobacco comments: as teenager   Clinical Intake:  Pre-visit preparation completed: Yes  Pain : No/denies pain     Nutritional Risks: None Diabetes: Yes CBG done?: No Did pt. bring in CBG monitor from home?: No  How often do you need to have someone help you when you read instructions, pamphlets, or other written materials from your doctor or pharmacy?: 1 - Never  Diabetic?  Yes  Nutrition Risk Assessment:  Has the patient had any N/V/D within the last 2 months?  No  Does the patient have any non-healing wounds?  No  Has the patient had any unintentional weight loss or weight gain?  No   Diabetes:  Is the patient diabetic?  Yes  If diabetic, was a CBG obtained today?  No  Did the patient bring in their glucometer from home?  No  How often do you monitor your CBG's? Does not check.   Financial Strains and Diabetes Management:  Are you having any financial strains with the device, your supplies or your medication? No .  Does the patient want to be seen by Chronic Care Management for management of their diabetes?  No  Would the patient like to be referred to a Nutritionist or for Diabetic Management?  No   Diabetic Exams:  Diabetic Eye Exam: Completed Pt has been advised about the importance in completing this exam.   Diabetic Foot Exam: Pt has been advised about the  importance in completing this exam. .    Interpreter Needed?: No  Information entered by :: Leroy Kennedy LPN   Activities of Daily Living    02/14/2022   12:42 PM  In your present state of health, do you have any difficulty performing the following activities:  Hearing? 0  Vision? 0  Difficulty concentrating or making decisions? 0  Walking or climbing stairs? 1  Dressing or bathing? 0  Doing errands, shopping? 0  Preparing Food and eating ? N  Using the Toilet? N  In the past six months, have you accidently leaked urine? N  Do you have problems with loss of bowel  control? N  Managing your Medications? N  Managing your Finances? N  Housekeeping or managing your Housekeeping? N    Patient Care Team: Jon Billings, NP as PCP - General  Indicate any recent Medical Services you may have received from other than [facility] providers in the past year (date may be approximate).     Assessment:   This is a routine wellness examination for [facility].  Hearing/Vision screen Hearing Screening - Comments:: No trouble hearing Vision Screening - Comments:: [facility] Up to date  Dietary issues and exercise activities discussed: Current Exercise Habits: The patient does not participate in regular exercise at present (does some stretching not on a regular basis)   Goals Addressed             This Visit's Progress    Weight (lb) < 200 lb (90.7 kg)         Depression Screen    02/14/2022   12:45 PM 12/27/2021    2:23 PM 02/12/2021    1:51 PM 12/17/2020    1:10 PM 02/10/2020   11:23 AM 08/29/2019    3:00 PM 08/01/2019    2:32 PM  PHQ 2/9 Scores  PHQ - 2 Score 0 0 0 0 0 0 0  PHQ- 9 Score 0 0    0 0    Fall Risk    02/14/2022   12:37 PM 12/27/2021    2:23 PM 02/12/2021    1:51 PM 02/10/2020   11:23 AM 08/29/2019    2:59 PM  [employer] in the past year? 0 0 0 0 0  Number falls in past yr: 0 0   0  Injury with Fall? 0 0   0  Risk for fall due to :  No Fall Risks Medication side  effect No Fall Risks   Follow up Falls evaluation completed;Education provided;Falls prevention discussed Falls evaluation completed Falls evaluation completed;Education provided;Falls prevention discussed Falls evaluation completed;Education provided;Falls prevention discussed     FALL RISK PREVENTION PERTAINING TO THE HOME:  Any stairs in or around the home? No  If so, are there any without handrails? No  Home free of loose throw rugs in walkways, pet beds, electrical cords, etc? Yes  Adequate lighting in your home to reduce risk of falls? Yes   ASSISTIVE DEVICES UTILIZED TO PREVENT FALLS:  Life alert? Yes  Use of a cane, walker or w/c? No  Grab bars in the bathroom? Yes  Shower chair or bench in shower? No  Elevated toilet seat or a handicapped toilet? Yes   TIMED UP AND GO:  Was the test performed? No .    Cognitive Function:    05/28/2021   11:38 AM 09/17/2020    2:56 PM  MMSE - Mini Mental State Exam  Orientation to time 5 5  Orientation to Place 5 5  Registration 1 3  Attention/ Calculation 0 0  Recall 2 2  Language- name 2 objects 2 2  Language- repeat 1 1  Language- follow 3 step command 3 3  Language- read & follow direction 1 1  Write a sentence 1 1  Copy design 0 0  Total score 21 23        02/14/2022   12:38 PM 02/12/2021    1:53 PM 02/10/2020   11:25 AM  6CIT Screen  What Year? 0 points 0 points 0 points  What month? 0 points 0 points 0 points  What time? 0 points 3 points 0  points  Count back from 20 2 points 0 points 4 points  Months in reverse 4 points 2 points 2 points  Repeat phrase 4 points 8 points 4 points  Total Score 10 points 13 points 10 points    Immunizations Immunization History  Administered Date(s) Administered   Fluad Quad(high Dose 65+) 03/05/2020   Influenza, High Dose Seasonal PF 04/09/2018   Influenza-Unspecified 06/11/2015, 05/07/2016, 06/04/2019   PFIZER(Purple Top)SARS-COV-2 Vaccination 10/21/2019, 11/11/2019,  11/11/2020   Pneumococcal Conjugate-13 12/12/2015   Pneumococcal Polysaccharide-23 08/29/2019   Zoster Recombinat (Shingrix) 03/27/2021    TDAP status: Due, Education has been provided regarding the importance of this vaccine. Advised may receive this vaccine at local pharmacy or Health Dept. Aware to provide a copy of the vaccination record if obtained from local pharmacy or Health Dept. Verbalized acceptance and understanding.  Flu Vaccine status: Due, Education has been provided regarding the importance of this vaccine. Advised may receive this vaccine at local pharmacy or Health Dept. Aware to provide a copy of the vaccination record if obtained from local pharmacy or Health Dept. Verbalized acceptance and understanding.  Pneumococcal vaccine status: Up to date  Covid-19 vaccine status: Information provided on how to obtain vaccines.   Qualifies for Shingles Vaccine? Yes   Zostavax completed No   Shingrix Completed?: No.    Education has been provided regarding the importance of this vaccine. Patient has been advised to call insurance company to determine out of pocket expense if they have not yet received this vaccine. Advised may also receive vaccine at local pharmacy or Health Dept. Verbalized acceptance and understanding.  Screening Tests Health Maintenance  Topic Date Due   URINE MICROALBUMIN  08/28/2020   Zoster Vaccines- Shingrix (2 of 2) 05/22/2021   INFLUENZA VACCINE  01/18/2022   COVID-19 Vaccine (4 - Pfizer series) 03/02/2022 (Originally 01/06/2021)   TETANUS/TDAP  02/15/2023 (Originally 01/02/1960)   OPHTHALMOLOGY EXAM  04/15/2022   FOOT EXAM  05/28/2022   HEMOGLOBIN A1C  06/29/2022   MAMMOGRAM  01/19/2023   Pneumonia Vaccine 58+ Years old  Completed   DEXA SCAN  Completed   Hepatitis C Screening  Completed   HPV VACCINES  Aged Out   COLONOSCOPY (Pts 45-1yr Insurance coverage will need to be confirmed)  Discontinued    Health Maintenance  Health Maintenance Due   Topic Date Due   URINE MICROALBUMIN  08/28/2020   Zoster Vaccines- Shingrix (2 of 2) 05/22/2021   INFLUENZA VACCINE  01/18/2022    Colorectal cancer screening: No longer required.   Mammogram status: Completed  . Repeat every year  Bone Density completed 2010  Lung Cancer Screening: (Low Dose CT Chest recommended if Age 860-80years, 30 pack-year currently smoking OR have quit w/in 15years.) does not qualify.   Lung Cancer Screening Referral:   Additional Screening:  Hepatitis C Screening: does not qualify; Completed 2022  Vision Screening: Recommended annual ophthalmology exams for early detection of glaucoma and other disorders of the eye. Is the patient up to date with their annual eye exam?  Yes  Who is the provider or what is the name of the office in which the patient attends annual eye exams? [facility] If pt is not established with a provider, would they like to be referred to a provider to establish care? No .   Dental Screening: Recommended annual dental exams for proper oral hygiene  Community Resource Referral / Chronic Care Management: CRR required this visit?  No   CCM required this visit?  No      Plan:     I have personally reviewed and noted the following in the patient's chart:   Medical and social history Use of alcohol, tobacco or illicit drugs  Current medications and supplements including opioid prescriptions. Patient is not currently taking opioid prescriptions. Functional ability and status Nutritional status Physical activity Advanced directives List of other physicians Hospitalizations, surgeries, and ER visits in previous 12 months Vitals Screenings to include cognitive, depression, and falls Referrals and appointments  In addition, I have reviewed and discussed with patient certain preventive protocols, quality metrics, and best practice recommendations. A written personalized care plan for preventive services as well as general preventive  health recommendations were provided to patient.     Leroy Kennedy, LPN   5/00/3704   Nurse Notes:

## 2022-02-14 NOTE — Patient Instructions (Signed)
Ms. Carol Armstrong , Thank you for taking time to come for your Medicare Wellness Visit. I appreciate your ongoing commitment to your health goals. Please review the following plan we discussed and let me know if I can assist you in the future.   Screening recommendations/referrals: Colonoscopy: no longer required Mammogram: up to date Bone Density: Education provided Recommended yearly ophthalmology/optometry visit for glaucoma screening and checkup Recommended yearly dental visit for hygiene and checkup  Vaccinations: Influenza vaccine: Education provided Pneumococcal vaccine: up to date Tdap vaccine: Education provided Shingles vaccine: Education provided    Advanced directives: Education provided     Preventive Care 40 Years and Older, Female Preventive care refers to lifestyle choices and visits with your health care provider that can promote health and wellness. What does preventive care include? A yearly physical exam. This is also called an annual well check. Dental exams once or twice a year. Routine eye exams. Ask your health care provider how often you should have your eyes checked. Personal lifestyle choices, including: Daily care of your teeth and gums. Regular physical activity. Eating a healthy diet. Avoiding tobacco and drug use. Limiting alcohol use. Practicing safe sex. Taking low-dose aspirin every day. Taking vitamin and mineral supplements as recommended by your health care provider. What happens during an annual well check? The services and screenings done by your health care provider during your annual well check will depend on your age, overall health, lifestyle risk factors, and family history of disease. Counseling  Your health care provider may ask you questions about your: Alcohol use. Tobacco use. Drug use. Emotional well-being. Home and relationship well-being. Sexual activity. Eating habits. History of falls. Memory and ability to understand  (cognition). Work and work Statistician. Reproductive health. Screening  You may have the following tests or measurements: Height, weight, and BMI. Blood pressure. Lipid and cholesterol levels. These may be checked every 5 years, or more frequently if you are over 63 years old. Skin check. Lung cancer screening. You may have this screening every year starting at age 63 if you have a 30-pack-year history of smoking and currently smoke or have quit within the past 15 years. Fecal occult blood test (FOBT) of the stool. You may have this test every year starting at age 71. Flexible sigmoidoscopy or colonoscopy. You may have a sigmoidoscopy every 5 years or a colonoscopy every 10 years starting at age 57. Hepatitis C blood test. Hepatitis B blood test. Sexually transmitted disease (STD) testing. Diabetes screening. This is done by checking your blood sugar (glucose) after you have not eaten for a while (fasting). You may have this done every 1-3 years. Bone density scan. This is done to screen for osteoporosis. You may have this done starting at age 41. Mammogram. This may be done every 1-2 years. Talk to your health care provider about how often you should have regular mammograms. Talk with your health care provider about your test results, treatment options, and if necessary, the need for more tests. Vaccines  Your health care provider may recommend certain vaccines, such as: Influenza vaccine. This is recommended every year. Tetanus, diphtheria, and acellular pertussis (Tdap, Td) vaccine. You may need a Td booster every 10 years. Zoster vaccine. You may need this after age 44. Pneumococcal 13-valent conjugate (PCV13) vaccine. One dose is recommended after age 35. Pneumococcal polysaccharide (PPSV23) vaccine. One dose is recommended after age 55. Talk to your health care provider about which screenings and vaccines you need and how often you need  them. This information is not intended to  replace advice given to you by your health care provider. Make sure you discuss any questions you have with your health care provider. Document Released: 07/03/2015 Document Revised: 02/24/2016 Document Reviewed: 04/07/2015 Elsevier Interactive Patient Education  2017 [place] Prevention in the Home Falls can cause injuries. They can happen to people of all ages. There are many things you can do to make your home safe and to help prevent falls. What can I do on the outside of my home? Regularly fix the edges of walkways and driveways and fix any cracks. Remove anything that might make you trip as you walk through a door, such as a raised step or threshold. Trim any bushes or trees on the path to your home. Use bright outdoor lighting. Clear any walking paths of anything that might make someone trip, such as rocks or tools. Regularly check to see if handrails are loose or broken. Make sure that both sides of any steps have handrails. Any raised decks and porches should have guardrails on the edges. Have any leaves, snow, or ice cleared regularly. Use sand or salt on walking paths during winter. Clean up any spills in your garage right away. This includes oil or grease spills. What can I do in the bathroom? Use night lights. Install grab bars by the toilet and in the tub and shower. Do not use towel bars as grab bars. Use non-skid mats or decals in the tub or shower. If you need to sit down in the shower, use a plastic, non-slip stool. Keep the floor dry. Clean up any water that spills on the floor as soon as it happens. Remove soap buildup in the tub or shower regularly. Attach bath mats securely with double-sided non-slip rug tape. Do not have throw rugs and other things on the floor that can make you trip. What can I do in the bedroom? Use night lights. Make sure that you have a light by your bed that is easy to reach. Do not use any sheets or blankets that are too big for  your bed. They should not hang down onto the floor. Have a firm chair that has side arms. You can use this for support while you get dressed. Do not have throw rugs and other things on the floor that can make you trip. What can I do in the kitchen? Clean up any spills right away. Avoid walking on wet floors. Keep items that you use a lot in easy-to-reach places. If you need to reach something above you, use a strong step stool that has a grab bar. Keep electrical cords out of the way. Do not use floor polish or wax that makes floors slippery. If you must use wax, use non-skid floor wax. Do not have throw rugs and other things on the floor that can make you trip. What can I do with my stairs? Do not leave any items on the stairs. Make sure that there are handrails on both sides of the stairs and use them. Fix handrails that are broken or loose. Make sure that handrails are as long as the stairways. Check any carpeting to make sure that it is firmly attached to the stairs. Fix any carpet that is loose or worn. Avoid having throw rugs at the top or bottom of the stairs. If you do have throw rugs, attach them to the floor with carpet tape. Make sure that you have a light switch at  the top of the stairs and the bottom of the stairs. If you do not have them, ask someone to add them for you. What else can I do to help prevent falls? Wear shoes that: Do not have high heels. Have rubber bottoms. Are comfortable and fit you well. Are closed at the toe. Do not wear sandals. If you use a stepladder: Make sure that it is fully opened. Do not climb a closed stepladder. Make sure that both sides of the stepladder are locked into place. Ask someone to hold it for you, if possible. Clearly mark and make sure that you can see: Any grab bars or handrails. First and last steps. Where the edge of each step is. Use tools that help you move around (mobility aids) if they are needed. These  include: Canes. Walkers. Scooters. Crutches. Turn on the lights when you go into a dark area. Replace any light bulbs as soon as they burn out. Set up your furniture so you have a clear path. Avoid moving your furniture around. If any of your floors are uneven, fix them. If there are any pets around you, be aware of where they are. Review your medicines with your doctor. Some medicines can make you feel dizzy. This can increase your chance of falling. Ask your doctor what other things that you can do to help prevent falls. This information is not intended to replace advice given to you by your health care provider. Make sure you discuss any questions you have with your health care provider. Document Released: 04/02/2009 Document Revised: 11/12/2015 Document Reviewed: 07/11/2014 Elsevier Interactive Patient Education  2017 [employer].

## 2022-03-28 NOTE — Progress Notes (Deleted)
There were no vitals taken for this visit.   Subjective:    Patient ID: Carol Armstrong, female    DOB: 01-10-1941, 81 y.o.   MRN: [id number]  HPI: Carol Armstrong is a 81 y.o. female presenting on 03/29/2022 for comprehensive medical examination. Current medical complaints include:none  She currently lives with: Menopausal Symptoms: no  HYPERTENSION / HYPERLIPIDEMIA Satisfied with current treatment? no Duration of hypertension: years BP monitoring frequency: not checking BP range:  BP medication side effects: no Past BP meds: amlodipine Duration of hyperlipidemia: years Cholesterol medication side effects: no Cholesterol supplements: none Past cholesterol medications: atorvastain (lipitor) Medication compliance: excellent compliance Aspirin: no Recent stressors: no Recurrent headaches: no Visual changes: no Palpitations: no Dyspnea: no Chest pain: no Lower extremity edema: no Dizzy/lightheaded: no  DIABETES Hypoglycemic episodes:{Blank single:19197::"yes","no"} Polydipsia/polyuria: {Blank single:19197::"yes","no"} Visual disturbance: {Blank single:19197::"yes","no"} Chest pain: {Blank single:19197::"yes","no"} Paresthesias: {Blank single:19197::"yes","no"} Glucose Monitoring: {Blank single:19197::"yes","no"}  Accucheck frequency: {Blank single:19197::"Not Checking","Daily","BID","TID"}  Fasting glucose:  Post prandial:  Evening:  Before meals: Taking Insulin?: {Blank single:19197::"yes","no"}  Long acting insulin:  Short acting insulin: Blood Pressure Monitoring: {Blank single:19197::"not checking","rarely","daily","weekly","monthly","a few times a day","a few times a week","a few times a month"} Retinal Examination: {Blank single:19197::"Up to Date","Not up to Date"} Foot Exam: {Blank single:19197::"Up to Date","Not up to Date"} Diabetic Education: {Blank single:19197::"Completed","Not Completed"} Pneumovax: {Blank single:19197::"Up to Date","Not up to  Date","unknown"} Influenza: {Blank single:19197::"Up to Date","Not up to Date","unknown"} Aspirin: {Blank single:19197::"yes","no"}   Depression Screen done today and results listed below:     02/14/2022   12:45 PM 12/27/2021    2:23 PM 02/12/2021    1:51 PM 12/17/2020    1:10 PM 02/10/2020   11:23 AM  Depression screen PHQ 2/9  Decreased Interest 0 0 0 0 0  Down, Depressed, Hopeless 0 0 0 0 0  PHQ - 2 Score 0 0 0 0 0  Altered sleeping 0 0     Tired, decreased energy 0 0     Change in appetite 0 0     Feeling bad or failure about yourself  0 0     Trouble concentrating 0 0     Moving slowly or fidgety/restless 0 0     Suicidal thoughts 0 0     PHQ-9 Score 0 0     Difficult doing work/chores Not difficult at all Not difficult at all       The patient does not have a history of falls. I did complete a risk assessment for falls. A plan of care for falls was documented.   Past Medical History:  Past Medical History:  Diagnosis Date   Hypertension    Prediabetes    Wears dentures    partial upper and lower    Surgical History:  Past Surgical History:  Procedure Laterality Date   COLONOSCOPY     COLONOSCOPY WITH PROPOFOL N/A 10/07/2019   Procedure: COLONOSCOPY WITH PROPOFOL;  Surgeon: Lucilla Lame, MD;  Location: [place];  Service: Endoscopy;  Laterality: N/A;  Latex  priority 4   POLYPECTOMY  10/07/2019   Procedure: POLYPECTOMY;  Surgeon: Lucilla Lame, MD;  Location: [facility];  Service: Endoscopy;;   TUBAL LIGATION      Medications:  Current Outpatient Medications on File Prior to Visit  Medication Sig   amLODipine (NORVASC) 5 MG tablet Take 1 tablet by mouth daily   atorvastatin (LIPITOR) 20 MG tablet TAKE 1 TABLET(20 MG) BY MOUTH DAILY   Multiple Vitamin (MULTIVITAMIN) capsule Take  by mouth.   SODIUM FLUORIDE 5000 PLUS 1.1 % CREA dental cream Take by mouth.   No current facility-administered medications on file prior to visit.    Allergies:   Allergies  Allergen Reactions   Latex Itching and Swelling    When gloves are WORN    Social History:  Social History   Socioeconomic History   Marital status: Divorced    Spouse name: Not on file   Number of children: Not on file   Years of education: Not on file   Highest education level: Not on file  Occupational History   Occupation: retired  Tobacco Use   Smoking status: Former   Smokeless tobacco: Never   Tobacco comments:    as teenager  Scientific laboratory technician Use: Never used  Substance and Sexual Activity   Alcohol use: Not Currently    Comment: occasinally wine   Drug use: Never   Sexual activity: Not Currently  Other Topics Concern   Not on file  Social History Narrative   Not on file   Social Determinants of Health   Financial Resource Strain: Low Risk  (02/14/2022)   Overall Financial Resource Strain (CARDIA)    Difficulty of Paying Living Expenses: Not hard at all  Food Insecurity: No Food Insecurity (02/14/2022)   Hunger Vital Sign    Worried About Running Out of Food in the Last Year: Never true    [place] in the Last Year: Never true  Transportation Needs: No Transportation Needs (02/14/2022)   PRAPARE - Hydrologist (Medical): No    Lack of Transportation (Non-Medical): No  Physical Activity: Inactive (02/14/2022)   Exercise Vital Sign    Days of Exercise per Week: 0 days    Minutes of Exercise per Session: 0 min  Stress: No Stress Concern Present (02/14/2022)   [place]    Feeling of Stress : Not at all  Social Connections: Moderately Isolated (02/14/2022)   Social Connection and Isolation Panel [NHANES]    Frequency of Communication with Friends and Family: More than three times a week    Frequency of Social Gatherings with Friends and Family: Twice a week    Attends Religious Services: More than 4 times per year    Active Member of [employer] or  Organizations: No    Attends Archivist Meetings: Never    Marital Status: Widowed  Intimate Partner Violence: Not At Risk (02/14/2022)   Humiliation, Afraid, Rape, and Kick questionnaire    Fear of Current or Ex-Partner: No    Emotionally Abused: No    Physically Abused: No    Sexually Abused: No   Social History   Tobacco Use  Smoking Status Former  Smokeless Tobacco Never  Tobacco Comments   as teenager   Social History   Substance and Sexual Activity  Alcohol Use Not Currently   Comment: occasinally wine    Family History:  Family History  Problem Relation Age of Onset   Hypertension Mother    Prostate cancer Father    Hypertension Sister    Hypertension Brother    Hypertension Sister    Hypertension Sister    Hypertension Sister    Breast cancer Neg Hx     Past medical history, surgical history, medications, allergies, family history and social history reviewed with patient today and changes made to appropriate areas of the chart.   Review of  Systems  Eyes:  Negative for blurred vision and double vision.  Respiratory:  Negative for shortness of breath.   Cardiovascular:  Negative for chest pain, palpitations and leg swelling.  Neurological:  Negative for dizziness and headaches.   All other ROS negative except what is listed above and in the HPI.      Objective:    There were no vitals taken for this visit.  Wt Readings from Last 3 Encounters:  12/27/21 186 lb 9.6 oz (84.6 kg)  05/28/21 188 lb 12.8 oz (85.6 kg)  02/12/21 196 lb (88.9 kg)    Physical Exam Vitals and nursing note reviewed.  Constitutional:      General: She is awake. She is not in acute distress.    Appearance: She is well-developed. She is not ill-appearing.  HENT:     Head: Normocephalic and atraumatic.     Right Ear: Hearing, tympanic membrane, ear canal and external ear normal. No drainage.     Left Ear: Hearing, tympanic membrane, ear canal and external ear normal. No  drainage.     Nose: Nose normal.     Right Sinus: No maxillary sinus tenderness or frontal sinus tenderness.     Left Sinus: No maxillary sinus tenderness or frontal sinus tenderness.     Mouth/Throat:     Mouth: Mucous membranes are moist.     Pharynx: Oropharynx is clear. Uvula midline. No pharyngeal swelling, oropharyngeal exudate or posterior oropharyngeal erythema.  Eyes:     General: Lids are normal.        Right eye: No discharge.        Left eye: No discharge.     Extraocular Movements: Extraocular movements intact.     Conjunctiva/sclera: Conjunctivae normal.     Pupils: Pupils are equal, round, and reactive to light.     Visual Fields: Right eye visual fields normal and left eye visual fields normal.  Neck:     Thyroid: No thyromegaly.     Vascular: No carotid bruit.     Trachea: Trachea normal.  Cardiovascular:     Rate and Rhythm: Normal rate and regular rhythm.     Heart sounds: Normal heart sounds. No murmur heard.    No gallop.  Pulmonary:     Effort: Pulmonary effort is normal. No accessory muscle usage or respiratory distress.     Breath sounds: Normal breath sounds.  Chest:  Breasts:    Right: Normal.     Left: Normal.  Abdominal:     General: Bowel sounds are normal.     Palpations: Abdomen is soft. There is no hepatomegaly or splenomegaly.     Tenderness: There is no abdominal tenderness.  Musculoskeletal:        General: Normal range of motion.     Cervical back: Normal range of motion and neck supple.     Right lower leg: No edema.     Left lower leg: No edema.  Lymphadenopathy:     Head:     Right side of head: No submental, submandibular, tonsillar, preauricular or posterior auricular adenopathy.     Left side of head: No submental, submandibular, tonsillar, preauricular or posterior auricular adenopathy.     Cervical: No cervical adenopathy.     Upper Body:     Right upper body: No supraclavicular, axillary or pectoral adenopathy.     Left upper  body: No supraclavicular, axillary or pectoral adenopathy.  Skin:    General: Skin is warm and dry.  Capillary Refill: Capillary refill takes less than 2 seconds.     Findings: No rash.  Neurological:     Mental Status: She is alert and oriented to person, place, and time.     Gait: Gait is intact.     Deep Tendon Reflexes: Reflexes are normal and symmetric.     Reflex Scores:      Brachioradialis reflexes are 2+ on the right side and 2+ on the left side.      Patellar reflexes are 2+ on the right side and 2+ on the left side. Psychiatric:        Attention and Perception: Attention normal.        Mood and Affect: Mood normal.        Speech: Speech normal.        Behavior: Behavior normal. Behavior is cooperative.        Thought Content: Thought content normal.        Judgment: Judgment normal.     Results for orders placed or performed in visit on 12/27/21  Comp Met (CMET)  Result Value Ref Range   Glucose 132 (H) 70 - 99 mg/dL   BUN 12 8 - 27 mg/dL   Creatinine, Ser 0.87 0.57 - 1.00 mg/dL   eGFR 68 >59 mL/min/1.73   BUN/Creatinine Ratio 14 12 - 28   Sodium 144 134 - 144 mmol/L   Potassium 3.8 3.5 - 5.2 mmol/L   Chloride 105 96 - 106 mmol/L   CO2 25 20 - 29 mmol/L   Calcium 9.1 8.7 - 10.3 mg/dL   Total Protein 6.8 6.0 - 8.5 g/dL   Albumin 4.3 3.8 - 4.8 g/dL   Globulin, Total 2.5 1.5 - 4.5 g/dL   Albumin/Globulin Ratio 1.7 1.2 - 2.2   Bilirubin Total 0.9 0.0 - 1.2 mg/dL   Alkaline Phosphatase 62 44 - 121 IU/L   AST 25 0 - 40 IU/L   ALT 19 0 - 32 IU/L  Lipid Profile  Result Value Ref Range   Cholesterol, Total 138 100 - 199 mg/dL   Triglycerides 72 0 - 149 mg/dL   HDL 62 >39 mg/dL   VLDL Cholesterol Cal 14 5 - 40 mg/dL   LDL Chol Calc (NIH) 62 0 - 99 mg/dL   Chol/HDL Ratio 2.2 0.0 - 4.4 ratio  HgB A1c  Result Value Ref Range   Hgb A1c MFr Bld 6.1 (H) 4.8 - 5.6 %   Est. average glucose Bld gHb Est-mCnc 128 mg/dL      Assessment & Plan:   Problem List Items  Addressed This Visit       Cardiovascular and Mediastinum   Essential hypertension   PAD (peripheral artery disease) (HCC) - Primary     Endocrine   Diabetes mellitus type 2, uncomplicated ([place])     Other   Hypercholesteremia     Follow up plan: No follow-ups on file.   LABORATORY TESTING:  - Pap smear: not applicable  IMMUNIZATIONS:   - Tdap: Tetanus vaccination status reviewed: not up to date. - Influenza: Postponed to flu season - Pneumovax: Up to date - Prevnar: Up to date - HPV: Not applicable - Zostavax vaccine:  Discussed today  SCREENING: -Mammogram: Ordered today  - Colonoscopy: Up to date  - Bone Density: Up to date  -Hearing Test: Not applicable  -Spirometry: Not applicable   PATIENT COUNSELING:   Advised to take 1 mg of folate supplement per day if capable of pregnancy.  Sexuality: Discussed sexually transmitted diseases, partner selection, use of condoms, avoidance of unintended pregnancy  and contraceptive alternatives.   Advised to avoid cigarette smoking.  I discussed with the patient that most people either abstain from alcohol or drink within safe limits (<=14/week and <=4 drinks/occasion for males, <=7/weeks and <= 3 drinks/occasion for females) and that the risk for alcohol disorders and other health effects rises proportionally with the number of drinks per week and how often a drinker exceeds daily limits.  Discussed cessation/primary prevention of drug use and availability of treatment for abuse.   Diet: Encouraged to adjust caloric intake to maintain  or achieve ideal body weight, to reduce intake of dietary saturated fat and total fat, to limit sodium intake by avoiding high sodium foods and not adding table salt, and to maintain adequate dietary potassium and calcium preferably from fresh fruits, vegetables, and low-fat dairy products.    stressed the importance of regular exercise  Injury prevention: Discussed safety belts, safety  helmets, smoke detector, smoking near bedding or upholstery.   Dental health: Discussed importance of regular tooth brushing, flossing, and dental visits.    NEXT PREVENTATIVE PHYSICAL DUE IN 1 YEAR. No follow-ups on file.

## 2022-03-29 ENCOUNTER — Ambulatory Visit: Payer: Medicare HMO | Admitting: Nurse Practitioner

## 2022-03-29 DIAGNOSIS — Z0000 Encounter for general adult medical examination without abnormal findings: Secondary | ICD-10-CM

## 2022-03-29 DIAGNOSIS — E7800 Pure hypercholesterolemia, unspecified: Secondary | ICD-10-CM

## 2022-03-29 DIAGNOSIS — E119 Type 2 diabetes mellitus without complications: Secondary | ICD-10-CM

## 2022-03-29 DIAGNOSIS — I739 Peripheral vascular disease, unspecified: Secondary | ICD-10-CM

## 2022-03-29 DIAGNOSIS — I10 Essential (primary) hypertension: Secondary | ICD-10-CM

## 2022-04-04 ENCOUNTER — Ambulatory Visit (INDEPENDENT_AMBULATORY_CARE_PROVIDER_SITE_OTHER): Payer: Medicare HMO | Admitting: Nurse Practitioner

## 2022-04-04 ENCOUNTER — Encounter: Payer: Self-pay | Admitting: Nurse Practitioner

## 2022-04-04 VITALS — BP 124/75 | HR 84 | Temp 98.6°F | Wt 186.6 lb

## 2022-04-04 DIAGNOSIS — I10 Essential (primary) hypertension: Secondary | ICD-10-CM | POA: Diagnosis not present

## 2022-04-04 DIAGNOSIS — E119 Type 2 diabetes mellitus without complications: Secondary | ICD-10-CM | POA: Diagnosis not present

## 2022-04-04 DIAGNOSIS — Z0000 Encounter for general adult medical examination without abnormal findings: Secondary | ICD-10-CM | POA: Diagnosis not present

## 2022-04-04 DIAGNOSIS — Z136 Encounter for screening for cardiovascular disorders: Secondary | ICD-10-CM | POA: Diagnosis not present

## 2022-04-04 DIAGNOSIS — G3184 Mild cognitive impairment, so stated: Secondary | ICD-10-CM | POA: Diagnosis not present

## 2022-04-04 MED ORDER — ATORVASTATIN CALCIUM 20 MG PO TABS: ORAL_TABLET | ORAL | 1 refills | Status: DC

## 2022-04-04 MED ORDER — AMLODIPINE BESYLATE 5 MG PO TABS: ORAL_TABLET | ORAL | 1 refills | Status: DC

## 2022-04-04 NOTE — Assessment & Plan Note (Signed)
Chronic.  Controlled.  Continue with current medication regimen.  Refills sent today.  Labs ordered today.  Return to clinic in 3 months for reevaluation.  Call sooner if concerns arise.

## 2022-04-04 NOTE — Progress Notes (Signed)
BP 124/75   Pulse 84   Temp 98.6 F (37 C) (Oral)   Wt 186 lb 9.6 oz (84.6 kg)   SpO2 96%   BMI 29.67 kg/m    Subjective:    Patient ID: Carol Armstrong, female    DOB: [birth year], 81 y.o.   MRN: [id number]  HPI: Carol Armstrong is a 81 y.o. female presenting on 04/04/2022 for comprehensive medical examination. Current medical complaints include:none  She currently lives with: Menopausal Symptoms: no  HYPERTENSION / HYPERLIPIDEMIA Satisfied with current treatment? no Duration of hypertension: years BP monitoring frequency: not checking BP range:  BP medication side effects: no Past BP meds: amlodipine Duration of hyperlipidemia: years Cholesterol medication side effects: no Cholesterol supplements: none Past cholesterol medications: atorvastain (lipitor) Medication compliance: excellent compliance Aspirin: no Recent stressors: no Recurrent headaches: no Visual changes: no Palpitations: no Dyspnea: no Chest pain: no Lower extremity edema: no Dizzy/lightheaded: no  DIABETES Hypoglycemic episodes:no Polydipsia/polyuria: no Visual disturbance: no Chest pain: no Paresthesias: no Glucose Monitoring: no  Accucheck frequency: Not Checking  Fasting glucose:  Post prandial:  Evening:  Before meals: Taking Insulin?: no  Long acting insulin:  Short acting insulin: Blood Pressure Monitoring: not checking Retinal Examination: Up to Date Foot Exam: Up to Date Diabetic Education: Not Completed Pneumovax: Up to Date Influenza: Up to Date Aspirin: no   Depression Screen done today and results listed below:     04/04/2022    3:56 PM 02/14/2022   12:45 PM 12/27/2021    2:23 PM 02/12/2021    1:51 PM 12/17/2020    1:10 PM  Depression screen PHQ 2/9  Decreased Interest 0 0 0 0 0  Down, Depressed, Hopeless 0 0 0 0 0  PHQ - 2 Score 0 0 0 0 0  Altered sleeping 0 0 0    Tired, decreased energy 0 0 0    Change in appetite 0 0 0    Feeling bad or failure about yourself  0  0 0    Trouble concentrating 0 0 0    Moving slowly or fidgety/restless 0 0 0    Suicidal thoughts 0 0 0    PHQ-9 Score 0 0 0    Difficult doing work/chores Not difficult at all Not difficult at all Not difficult at all      The patient does not have a history of falls. I did complete a risk assessment for falls. A plan of care for falls was documented.   Past Medical History:  Past Medical History:  Diagnosis Date   Hypertension    Prediabetes    Wears dentures    partial upper and lower    Surgical History:  Past Surgical History:  Procedure Laterality Date   COLONOSCOPY     COLONOSCOPY WITH PROPOFOL N/A 10/07/2019   Procedure: COLONOSCOPY WITH PROPOFOL;  Surgeon: Midge Minium, MD;  Location: [facility];  Service: Endoscopy;  Laterality: N/A;  Latex  priority 4   POLYPECTOMY  10/07/2019   Procedure: POLYPECTOMY;  Surgeon: Midge Minium, MD;  Location: [facility];  Service: Endoscopy;;   TUBAL LIGATION      Medications:  Current Outpatient Medications on File Prior to Visit  Medication Sig   Multiple Vitamin (MULTIVITAMIN) capsule Take by mouth.   SODIUM FLUORIDE 5000 PLUS 1.1 % CREA dental cream Take by mouth.   No current facility-administered medications on file prior to visit.    Allergies:  Allergies  Allergen Reactions  Latex Itching and Swelling    When gloves are WORN    Social History:  Social History   Socioeconomic History   Marital status: Divorced    Spouse name: Not on file   Number of children: Not on file   Years of education: Not on file   Highest education level: Not on file  Occupational History   Occupation: retired  Tobacco Use   Smoking status: Former   Smokeless tobacco: Never   Tobacco comments:    as teenager  Scientific laboratory technician Use: Never used  Substance and Sexual Activity   Alcohol use: Not Currently    Comment: occasinally wine   Drug use: Never   Sexual activity: Not Currently  Other Topics Concern    Not on file  Social History Narrative   Not on file   Social Determinants of Health   Financial Resource Strain: Low Risk  (02/14/2022)   Overall Financial Resource Strain (CARDIA)    Difficulty of Paying Living Expenses: Not hard at all  Food Insecurity: No Food Insecurity (02/14/2022)   Hunger Vital Sign    Worried About Running Out of Food in the Last Year: Never true    [place] in the Last Year: Never true  Transportation Needs: No Transportation Needs (02/14/2022)   PRAPARE - Hydrologist (Medical): No    Lack of Transportation (Non-Medical): No  Physical Activity: Inactive (02/14/2022)   Exercise Vital Sign    Days of Exercise per Week: 0 days    Minutes of Exercise per Session: 0 min  Stress: No Stress Concern Present (02/14/2022)   [place]    Feeling of Stress : Not at all  Social Connections: Moderately Isolated (02/14/2022)   Social Connection and Isolation Panel [NHANES]    Frequency of Communication with Friends and Family: More than three times a week    Frequency of Social Gatherings with Friends and Family: Twice a week    Attends Religious Services: More than 4 times per year    Active Member of [employer] or Organizations: No    Attends Archivist Meetings: Never    Marital Status: Widowed  Intimate Partner Violence: Not At Risk (02/14/2022)   Humiliation, Afraid, Rape, and Kick questionnaire    Fear of Current or Ex-Partner: No    Emotionally Abused: No    Physically Abused: No    Sexually Abused: No   Social History   Tobacco Use  Smoking Status Former  Smokeless Tobacco Never  Tobacco Comments   as teenager   Social History   Substance and Sexual Activity  Alcohol Use Not Currently   Comment: occasinally wine    Family History:  Family History  Problem Relation Age of Onset   Hypertension Mother    Prostate cancer Father     Hypertension Sister    Hypertension Brother    Hypertension Sister    Hypertension Sister    Hypertension Sister    Breast cancer Neg Hx     Past medical history, surgical history, medications, allergies, family history and social history reviewed with patient today and changes made to appropriate areas of the chart.   Review of Systems  Eyes:  Negative for blurred vision and double vision.  Respiratory:  Negative for shortness of breath.   Cardiovascular:  Negative for chest pain, palpitations and leg swelling.  Neurological:  Negative for dizziness and  headaches.       Declining memory   All other ROS negative except what is listed above and in the HPI.      Objective:    BP 124/75   Pulse 84   Temp 98.6 F (37 C) (Oral)   Wt 186 lb 9.6 oz (84.6 kg)   SpO2 96%   BMI 29.67 kg/m   Wt Readings from Last 3 Encounters:  04/04/22 186 lb 9.6 oz (84.6 kg)  12/27/21 186 lb 9.6 oz (84.6 kg)  05/28/21 188 lb 12.8 oz (85.6 kg)    Physical Exam Vitals and nursing note reviewed.  Constitutional:      General: She is awake. She is not in acute distress.    Appearance: She is well-developed. She is not ill-appearing.  HENT:     Head: Normocephalic and atraumatic.     Right Ear: Hearing, tympanic membrane, ear canal and external ear normal. No drainage.     Left Ear: Hearing, tympanic membrane, ear canal and external ear normal. No drainage.     Nose: Nose normal.     Right Sinus: No maxillary sinus tenderness or frontal sinus tenderness.     Left Sinus: No maxillary sinus tenderness or frontal sinus tenderness.     Mouth/Throat:     Mouth: Mucous membranes are moist.     Pharynx: Oropharynx is clear. Uvula midline. No pharyngeal swelling, oropharyngeal exudate or posterior oropharyngeal erythema.  Eyes:     General: Lids are normal.        Right eye: No discharge.        Left eye: No discharge.     Extraocular Movements: Extraocular movements intact.     Conjunctiva/sclera:  Conjunctivae normal.     Pupils: Pupils are equal, round, and reactive to light.     Visual Fields: Right eye visual fields normal and left eye visual fields normal.  Neck:     Thyroid: No thyromegaly.     Vascular: No carotid bruit.     Trachea: Trachea normal.  Cardiovascular:     Rate and Rhythm: Normal rate and regular rhythm.     Heart sounds: Normal heart sounds. No murmur heard.    No gallop.  Pulmonary:     Effort: Pulmonary effort is normal. No accessory muscle usage or respiratory distress.     Breath sounds: Normal breath sounds.  Chest:  Breasts:    Right: Normal.     Left: Normal.  Abdominal:     General: Bowel sounds are normal.     Palpations: Abdomen is soft. There is no hepatomegaly or splenomegaly.     Tenderness: There is no abdominal tenderness.  Musculoskeletal:        General: Normal range of motion.     Cervical back: Normal range of motion and neck supple.     Right lower leg: No edema.     Left lower leg: No edema.  Lymphadenopathy:     Head:     Right side of head: No submental, submandibular, tonsillar, preauricular or posterior auricular adenopathy.     Left side of head: No submental, submandibular, tonsillar, preauricular or posterior auricular adenopathy.     Cervical: No cervical adenopathy.     Upper Body:     Right upper body: No supraclavicular, axillary or pectoral adenopathy.     Left upper body: No supraclavicular, axillary or pectoral adenopathy.  Skin:    General: Skin is warm and dry.     Capillary  Refill: Capillary refill takes less than 2 seconds.     Findings: No rash.  Neurological:     Mental Status: She is alert and oriented to person, place, and time.     Gait: Gait is intact.     Deep Tendon Reflexes: Reflexes are normal and symmetric.     Reflex Scores:      Brachioradialis reflexes are 2+ on the right side and 2+ on the left side.      Patellar reflexes are 2+ on the right side and 2+ on the left side. Psychiatric:         Attention and Perception: Attention normal.        Mood and Affect: Mood normal.        Speech: Speech normal.        Behavior: Behavior normal. Behavior is cooperative.        Thought Content: Thought content normal.        Judgment: Judgment normal.     Comments: Recall is slower     Results for orders placed or performed in visit on 12/27/21  Comp Met (CMET)  Result Value Ref Range   Glucose 132 (H) 70 - 99 mg/dL   BUN 12 8 - 27 mg/dL   Creatinine, Ser 0.87 0.57 - 1.00 mg/dL   eGFR 68 >59 mL/min/1.73   BUN/Creatinine Ratio 14 12 - 28   Sodium 144 134 - 144 mmol/L   Potassium 3.8 3.5 - 5.2 mmol/L   Chloride 105 96 - 106 mmol/L   CO2 25 20 - 29 mmol/L   Calcium 9.1 8.7 - 10.3 mg/dL   Total Protein 6.8 6.0 - 8.5 g/dL   Albumin 4.3 3.8 - 4.8 g/dL   Globulin, Total 2.5 1.5 - 4.5 g/dL   Albumin/Globulin Ratio 1.7 1.2 - 2.2   Bilirubin Total 0.9 0.0 - 1.2 mg/dL   Alkaline Phosphatase 62 44 - 121 IU/L   AST 25 0 - 40 IU/L   ALT 19 0 - 32 IU/L  Lipid Profile  Result Value Ref Range   Cholesterol, Total 138 100 - 199 mg/dL   Triglycerides 72 0 - 149 mg/dL   HDL 62 >39 mg/dL   VLDL Cholesterol Cal 14 5 - 40 mg/dL   LDL Chol Calc (NIH) 62 0 - 99 mg/dL   Chol/HDL Ratio 2.2 0.0 - 4.4 ratio  HgB A1c  Result Value Ref Range   Hgb A1c MFr Bld 6.1 (H) 4.8 - 5.6 %   Est. average glucose Bld gHb Est-mCnc 128 mg/dL      Assessment & Plan:   Problem List Items Addressed This Visit       Cardiovascular and Mediastinum   Essential hypertension    Chronic.  Controlled.  Continue with current medication regimen.  Refills sent today.  Labs ordered today.  Return to clinic in 3 months for reevaluation.  Call sooner if concerns arise.        Relevant Medications   amLODipine (NORVASC) 5 MG tablet   atorvastatin (LIPITOR) 20 MG tablet     Endocrine   Diabetes mellitus type 2, uncomplicated (HCC)    Chronic.  Controlled.  Continue with current medication regimen.  Labs ordered  today.  Return to clinic in 6 months for reevaluation.  Call sooner if concerns arise.        Relevant Medications   atorvastatin (LIPITOR) 20 MG tablet   Other Relevant Orders   HgB A1c   Microalbumin, Urine Waived  Other   Mild cognitive impairment    Chronic.  Cancelled appt with Neurology.  Decided she didn't want to go.  Discussed starting anticholinesterase inhibitor today.  Patient declined at this time.  Will discuss at next visit.  Follow up in 3 months.  Call sooner if concerns arise.       Other Visit Diagnoses     Annual physical exam    -  Primary   Health maintenance reviewed during visit today.  Labs ordered. Flu shot up to date.     Relevant Orders   CBC with Differential/Platelet   Comprehensive metabolic panel   Lipid panel   TSH   Urinalysis, Routine w reflex microscopic   Screening for ischemic heart disease       Relevant Orders   Lipid panel        Follow up plan: No follow-ups on file.   LABORATORY TESTING:  - Pap smear: not applicable  IMMUNIZATIONS:   - Tdap: Tetanus vaccination status reviewed: not up to date. - Influenza: Postponed to flu season - Pneumovax: Up to date - Prevnar: Up to date - HPV: Not applicable - Zostavax vaccine:  Discussed today  SCREENING: -Mammogram: Ordered today  - Colonoscopy: Up to date  - Bone Density: Up to date  -Hearing Test: Not applicable  -Spirometry: Not applicable   PATIENT COUNSELING:   Advised to take 1 mg of folate supplement per day if capable of pregnancy.   Sexuality: Discussed sexually transmitted diseases, partner selection, use of condoms, avoidance of unintended pregnancy  and contraceptive alternatives.   Advised to avoid cigarette smoking.  I discussed with the patient that most people either abstain from alcohol or drink within safe limits (<=14/week and <=4 drinks/occasion for males, <=7/weeks and <= 3 drinks/occasion for females) and that the risk for alcohol disorders and  other health effects rises proportionally with the number of drinks per week and how often a drinker exceeds daily limits.  Discussed cessation/primary prevention of drug use and availability of treatment for abuse.   Diet: Encouraged to adjust caloric intake to maintain  or achieve ideal body weight, to reduce intake of dietary saturated fat and total fat, to limit sodium intake by avoiding high sodium foods and not adding table salt, and to maintain adequate dietary potassium and calcium preferably from fresh fruits, vegetables, and low-fat dairy products.    stressed the importance of regular exercise  Injury prevention: Discussed safety belts, safety helmets, smoke detector, smoking near bedding or upholstery.   Dental health: Discussed importance of regular tooth brushing, flossing, and dental visits.    NEXT PREVENTATIVE PHYSICAL DUE IN 1 YEAR. No follow-ups on file.

## 2022-04-04 NOTE — Assessment & Plan Note (Signed)
Chronic.  Cancelled appt with Neurology.  Decided she didn't want to go.  Discussed starting anticholinesterase inhibitor today.  Patient declined at this time.  Will discuss at next visit.  Follow up in 3 months.  Call sooner if concerns arise.

## 2022-04-04 NOTE — Assessment & Plan Note (Signed)
Chronic.  Controlled.  Continue with current medication regimen.  Labs ordered today.  Return to clinic in 6 months for reevaluation.  Call sooner if concerns arise.  ? ?

## 2022-04-05 LAB — URINALYSIS, ROUTINE W REFLEX MICROSCOPIC
Bilirubin, UA: NEGATIVE
Glucose, UA: NEGATIVE
Ketones, UA: NEGATIVE
Nitrite, UA: NEGATIVE
Protein,UA: NEGATIVE
RBC, UA: NEGATIVE
Specific Gravity, UA: 1.02 (ref 1.005–1.030)
Urobilinogen, Ur: 0.2 mg/dL (ref 0.2–1.0)
pH, UA: 6 (ref 5.0–7.5)

## 2022-04-05 LAB — CBC WITH DIFFERENTIAL/PLATELET
Basophils Absolute: 0 10*3/uL (ref 0.0–0.2)
Basos: 0 %
EOS (ABSOLUTE): 0.2 10*3/uL (ref 0.0–0.4)
Eos: 3 %
Hematocrit: 43.1 % (ref 34.0–46.6)
Hemoglobin: 14.4 g/dL (ref 11.1–15.9)
Immature Grans (Abs): 0 10*3/uL (ref 0.0–0.1)
Immature Granulocytes: 0 %
Lymphocytes Absolute: 2.5 10*3/uL (ref 0.7–3.1)
Lymphs: 36 %
MCH: 29.5 pg (ref 26.6–33.0)
MCHC: 33.4 g/dL (ref 31.5–35.7)
MCV: 88 fL (ref 79–97)
Monocytes Absolute: 0.6 10*3/uL (ref 0.1–0.9)
Monocytes: 8 %
Neutrophils Absolute: 3.7 10*3/uL (ref 1.4–7.0)
Neutrophils: 53 %
Platelets: 199 10*3/uL (ref 150–450)
RBC: 4.88 x10E6/uL (ref 3.77–5.28)
RDW: 12.5 % (ref 11.7–15.4)
WBC: 7 10*3/uL (ref 3.4–10.8)

## 2022-04-05 LAB — COMPREHENSIVE METABOLIC PANEL
ALT: 23 IU/L (ref 0–32)
AST: 18 IU/L (ref 0–40)
Albumin/Globulin Ratio: 1.7 (ref 1.2–2.2)
Albumin: 4.3 g/dL (ref 3.8–4.8)
Alkaline Phosphatase: 64 IU/L (ref 44–121)
BUN/Creatinine Ratio: 13 (ref 12–28)
BUN: 11 mg/dL (ref 8–27)
Bilirubin Total: 1.3 mg/dL — ABNORMAL HIGH (ref 0.0–1.2)
CO2: 24 mmol/L (ref 20–29)
Calcium: 9.3 mg/dL (ref 8.7–10.3)
Chloride: 105 mmol/L (ref 96–106)
Creatinine, Ser: 0.84 mg/dL (ref 0.57–1.00)
Globulin, Total: 2.5 g/dL (ref 1.5–4.5)
Glucose: 86 mg/dL (ref 70–99)
Potassium: 4 mmol/L (ref 3.5–5.2)
Sodium: 144 mmol/L (ref 134–144)
Total Protein: 6.8 g/dL (ref 6.0–8.5)
eGFR: 71 mL/min/{1.73_m2} (ref >59)

## 2022-04-05 LAB — MICROSCOPIC EXAMINATION: Bacteria, UA: NONE SEEN

## 2022-04-05 LAB — LIPID PANEL
Chol/HDL Ratio: 2.5 ratio (ref 0.0–4.4)
Cholesterol, Total: 160 mg/dL (ref 100–199)
HDL: 65 mg/dL (ref >39)
LDL Chol Calc (NIH): 79 mg/dL (ref 0–99)
Triglycerides: 89 mg/dL (ref 0–149)
VLDL Cholesterol Cal: 16 mg/dL (ref 5–40)

## 2022-04-05 LAB — HEMOGLOBIN A1C
Est. average glucose Bld gHb Est-mCnc: 128 mg/dL
Hgb A1c MFr Bld: 6.1 % — ABNORMAL HIGH (ref 4.8–5.6)

## 2022-04-05 LAB — TSH: TSH: 2.16 u[IU]/mL (ref 0.450–4.500)

## 2022-04-05 NOTE — Progress Notes (Signed)
Please let patient know that her lab work looks good.  No concerns at this time. Continue with current medication regimen.  Follow up as discussed.

## 2022-04-06 NOTE — Progress Notes (Signed)
Good Morning.  Ms. Carol Armstrong urinalysis looks good.  No other concerns at this time.

## 2022-04-18 DIAGNOSIS — H2513 Age-related nuclear cataract, bilateral: Secondary | ICD-10-CM | POA: Diagnosis not present

## 2022-04-18 DIAGNOSIS — R7309 Other abnormal glucose: Secondary | ICD-10-CM | POA: Diagnosis not present

## 2022-04-18 DIAGNOSIS — H524 Presbyopia: Secondary | ICD-10-CM | POA: Diagnosis not present

## 2022-04-18 DIAGNOSIS — H5203 Hypermetropia, bilateral: Secondary | ICD-10-CM | POA: Diagnosis not present

## 2022-04-18 DIAGNOSIS — H52223 Regular astigmatism, bilateral: Secondary | ICD-10-CM | POA: Diagnosis not present

## 2022-04-18 LAB — HM DIABETES EYE EXAM

## 2022-09-01 ENCOUNTER — Ambulatory Visit (INDEPENDENT_AMBULATORY_CARE_PROVIDER_SITE_OTHER): Payer: Medicare Other | Admitting: Nurse Practitioner

## 2022-09-01 ENCOUNTER — Encounter: Payer: Self-pay | Admitting: Nurse Practitioner

## 2022-09-01 VITALS — BP 161/74 | HR 99 | Temp 98.5°F | Wt 180.0 lb

## 2022-09-01 DIAGNOSIS — R4701 Aphasia: Secondary | ICD-10-CM | POA: Diagnosis not present

## 2022-09-01 NOTE — Progress Notes (Signed)
BP (!) 161/74   Pulse 99   Temp 98.5 F (36.9 C) (Oral)   Wt 180 lb (81.6 kg)   SpO2 95%   BMI 28.62 kg/m    Subjective:    Patient ID: Carol Armstrong, female    DOB: Apr 22, 1941, 82 y.o.   MRN: [id number]  HPI: Carol Armstrong is a 82 y.o. female  Chief Complaint  Patient presents with   Memory Loss    Pt's daughter states that she has noticed that the patient has been stuttering, states that her mom can remember things but doesn't want to speak because she is worried about the stuttering    MEMORY ISSUES Patient's daughter states that she has noticed that the patient has been stuttering.  She can remember things but doesn't want to speak because she is worried about stuttering.  Patient's daughter feels like her symptoms started   She feels like a year ago she was "johnny on the spot".  Now she feels like she isn't answering any the questions she is being asked and used to be quick to answer.  Even on the phone her daughter feels like it takes her longer to answer questions.  In the morning she is slower to answer than she is in the afternoon.  Clarene Critchley)      09/01/2022    4:48 PM 05/28/2021   11:38 AM 09/17/2020    2:56 PM  MMSE - Mini Mental State Exam  Orientation to time 3 5 5   Orientation to Place 5 5 5   Registration 3 1 3   Attention/ Calculation 0 0 0  Recall 3 2 2   Language- name 2 objects 2 2 2   Language- repeat 1 1 1   Language- follow 3 step command 3 3 3   Language- read & follow direction 1 1 1   Write a sentence 0 1 1  Copy design 0 0 0  Total score 21 21 23      Relevant past medical, surgical, family and social history reviewed and updated as indicated. Interim medical history since our last visit reviewed. Allergies and medications reviewed and updated.  Review of Systems  Neurological:        Stuttering to get words out    Per HPI unless specifically indicated above     Objective:    BP (!) 161/74   Pulse 99   Temp 98.5 F (36.9 C) (Oral)    Wt 180 lb (81.6 kg)   SpO2 95%   BMI 28.62 kg/m   Wt Readings from Last 3 Encounters:  09/01/22 180 lb (81.6 kg)  04/04/22 186 lb 9.6 oz (84.6 kg)  12/27/21 186 lb 9.6 oz (84.6 kg)    Physical Exam Vitals and nursing note reviewed.  Constitutional:      General: She is not in acute distress.    Appearance: Normal appearance. She is normal weight. She is not ill-appearing, toxic-appearing or diaphoretic.  HENT:     Head: Normocephalic.     Right Ear: External ear normal.     Left Ear: External ear normal.     Nose: Nose normal.     Mouth/Throat:     Mouth: Mucous membranes are moist.     Pharynx: Oropharynx is clear.  Eyes:     General: No visual field deficit.       Right eye: No discharge.        Left eye: No discharge.     Extraocular Movements: Extraocular movements intact.  Conjunctiva/sclera: Conjunctivae normal.     Pupils: Pupils are equal, round, and reactive to light.  Cardiovascular:     Rate and Rhythm: Normal rate and regular rhythm.     Heart sounds: No murmur heard. Pulmonary:     Effort: Pulmonary effort is normal. No respiratory distress.     Breath sounds: Normal breath sounds. No wheezing or rales.  Musculoskeletal:     Cervical back: Normal range of motion and neck supple.  Skin:    General: Skin is warm and dry.     Capillary Refill: Capillary refill takes less than 2 seconds.  Neurological:     General: No focal deficit present.     Mental Status: She is alert and oriented to person, place, and time. Mental status is at baseline.     Cranial Nerves: No cranial nerve deficit, dysarthria or facial asymmetry.     Sensory: Sensation is intact.     Motor: Weakness (right hand weaker than her left) present.     Gait: Gait is intact.     Comments: Hesitation with speaking.  Stuttering to get words out although she knows what she wants to say.    Psychiatric:        Mood and Affect: Mood normal.        Behavior: Behavior normal.        Thought  Content: Thought content normal.        Judgment: Judgment normal.     Results for orders placed or performed in visit on 04/19/22  HM DIABETES EYE EXAM  Result Value Ref Range   HM Diabetic Eye Exam No Retinopathy No Retinopathy      Assessment & Plan:   Problem List Items Addressed This Visit       Other   Aphasia - Primary    Ongoing over the last year.  Patient is hesitant to talk or answer questions due to difficulty speaking.  Patient's daughter has witnessed this over the last year.  Exam shows left sided weakness.  Concern that patient has had previous stroke.  Will order MRI.  Once results return will discuss further treatment.  MMSE 21 during visit today.       Relevant Orders   MR Brain W Wo Contrast     Follow up plan: Return in about 2 months (around 11/01/2022) for HTN, HLD, DM2 FU.  A total of 30 minutes were spent on this encounter today.  When total time is documented, this includes both the face-to-face and non-face-to-face time personally spent before, during and after the visit on the date of the encounter discussing symptoms, plan of care, reviewing MRI process, and follow up.

## 2022-09-02 DIAGNOSIS — R4701 Aphasia: Secondary | ICD-10-CM | POA: Insufficient documentation

## 2022-09-02 NOTE — Assessment & Plan Note (Addendum)
Ongoing over the last year.  Patient is hesitant to talk or answer questions due to difficulty speaking.  Patient's daughter has witnessed this over the last year.  Exam shows left sided weakness.  Concern that patient has had previous stroke.  Will order MRI.  Once results return will discuss further treatment.  MMSE 21 during visit today.

## 2022-09-15 ENCOUNTER — Ambulatory Visit: Payer: Medicare HMO | Admitting: Nurse Practitioner

## 2022-10-27 ENCOUNTER — Telehealth: Payer: Self-pay | Admitting: Nurse Practitioner

## 2022-10-27 NOTE — Telephone Encounter (Signed)
Can the MRI order be followed up on please?   Clydie Braun- can you advise on the options to help the patient relax.

## 2022-10-27 NOTE — Telephone Encounter (Signed)
Can you follow up on this MRI and find out why this hasn't been scheduled.  I routed this to the referral coordinator.  We will discuss options to calm her down once this has been scheduled.

## 2022-10-27 NOTE — Telephone Encounter (Signed)
Copied from CRM [id number]. Topic: Referral - Request for Referral >> Oct 27, 2022 10:19 AM Everette C wrote: Has patient seen PCP for this complaint? Yes.   *If NO, is insurance requiring patient see PCP for this issue before PCP can refer them? Referral for which specialty:Radiology / Imaging  Preferred provider/office: Patient has no preference but would like it in [place]  Reason for referral: Patient would like an MRI >> Oct 27, 2022 10:31 AM Everette C wrote: The patient's daughter Abran Cantor would like to be contacted at [phone] if possible to discuss potential options to help the patient relax during the procedure

## 2022-10-28 NOTE — Telephone Encounter (Signed)
Called and spoke to the patient's daughter. Advised her of Carol Armstrong's message regarding a medication to help calm her mom down for the procedure. Also provided her the number to schedule the MRI.

## 2022-11-01 ENCOUNTER — Ambulatory Visit (INDEPENDENT_AMBULATORY_CARE_PROVIDER_SITE_OTHER): Payer: Medicare Other | Admitting: Nurse Practitioner

## 2022-11-01 ENCOUNTER — Encounter: Payer: Self-pay | Admitting: Nurse Practitioner

## 2022-11-01 VITALS — BP 125/69 | HR 101 | Temp 98.0°F | Wt 183.8 lb

## 2022-11-01 DIAGNOSIS — I10 Essential (primary) hypertension: Secondary | ICD-10-CM | POA: Diagnosis not present

## 2022-11-01 DIAGNOSIS — E6609 Other obesity due to excess calories: Secondary | ICD-10-CM

## 2022-11-01 DIAGNOSIS — I739 Peripheral vascular disease, unspecified: Secondary | ICD-10-CM

## 2022-11-01 DIAGNOSIS — R4701 Aphasia: Secondary | ICD-10-CM

## 2022-11-01 DIAGNOSIS — E7800 Pure hypercholesterolemia, unspecified: Secondary | ICD-10-CM | POA: Diagnosis not present

## 2022-11-01 DIAGNOSIS — E119 Type 2 diabetes mellitus without complications: Secondary | ICD-10-CM

## 2022-11-01 DIAGNOSIS — Z6831 Body mass index (BMI) 31.0-31.9, adult: Secondary | ICD-10-CM

## 2022-11-01 MED ORDER — DIAZEPAM 5 MG PO TABS: 5.0000 mg | ORAL_TABLET | ORAL | 0 refills | Status: DC | PRN

## 2022-11-01 MED ORDER — AMLODIPINE BESYLATE 5 MG PO TABS: ORAL_TABLET | ORAL | 1 refills | Status: DC

## 2022-11-01 MED ORDER — ATORVASTATIN CALCIUM 20 MG PO TABS: ORAL_TABLET | ORAL | 1 refills | Status: DC

## 2022-11-01 NOTE — Assessment & Plan Note (Signed)
Will get MRI on June 5.  Sent Diazepam for patient to take prior to exam.  Will make recommendations based on imaging results.

## 2022-11-01 NOTE — Assessment & Plan Note (Signed)
Chronic.  Controlled.  Last A1c was 6.1%.  Foot exam and Microalbumin up dated today.  Controlled without medication.  Labs ordered today.  Return to clinic in 6 months for reevaluation.  Call sooner if concerns arise.

## 2022-11-01 NOTE — Assessment & Plan Note (Signed)
Chronic.  Controlled.  Continue with current medication regimen on Atorvastatin 20mg daily.  Refills sent today.  Labs ordered today.  Return to clinic in 6 months for reevaluation.  Call sooner if concerns arise.   

## 2022-11-01 NOTE — Assessment & Plan Note (Signed)
Chronic.  Controlled.  Continue with current medication regimen of Amlodipine 10mg.  Refills sent today.  Labs ordered today.  Return to clinic in 6 months for reevaluation.  Call sooner if concerns arise.   

## 2022-11-01 NOTE — Assessment & Plan Note (Signed)
Chronic. Controlled at this time.  Discussed symptoms to monitor for.

## 2022-11-01 NOTE — Progress Notes (Signed)
BP 125/69   Pulse (!) 101   Temp 98 F (36.7 C) (Oral)   Wt 183 lb 12.8 oz (83.4 kg)   SpO2 97%   BMI 29.22 kg/m    Subjective:    Patient ID: Carol Armstrong, female    DOB: 1940/11/21, 82 y.o.   MRN: [id number]  HPI: Carol Armstrong is a 82 y.o. female  Chief Complaint  Patient presents with   Hyperlipidemia   Hypertension   Diabetes   HYPERTENSION / HYPERLIPIDEMIA Satisfied with current treatment? no Duration of hypertension: years BP monitoring frequency: not checking BP range:  BP medication side effects: no Past BP meds: amlodipine Duration of hyperlipidemia: years Cholesterol medication side effects: no Cholesterol supplements: none Past cholesterol medications: atorvastain (lipitor) Medication compliance: excellent compliance Aspirin: no Recent stressors: no Recurrent headaches: no Visual changes: no Palpitations: no Dyspnea: no Chest pain: no Lower extremity edema: no Dizzy/lightheaded: no  DIABETES Hypoglycemic episodes:no Polydipsia/polyuria: no Visual disturbance: no Chest pain: no Paresthesias: no Glucose Monitoring: no  Accucheck frequency: Not Checking  Fasting glucose:  Post prandial:  Evening:  Before meals: Taking Insulin?: no  Long acting insulin:  Short acting insulin: Blood Pressure Monitoring: not checking Retinal Examination: Up to Date Foot Exam: Up to Date Diabetic Education: Not Completed Pneumovax: Up to Date Influenza: Up to Date Aspirin: no   Carol Armstrong- [phone] Patient feels like her memory is about the same.  She has decided to get the MRI of the brain.  She would like something to help calm her down prior to the MRI.     Relevant past medical, surgical, family and social history reviewed and updated as indicated. Interim medical history since our last visit reviewed. Allergies and medications reviewed and updated.  Review of Systems  Eyes:  Negative for visual disturbance.  Respiratory:  Negative  for cough, chest tightness and shortness of breath.   Cardiovascular:  Negative for chest pain, palpitations and leg swelling.  Endocrine: Negative for polydipsia and polyuria.  Musculoskeletal:        Left thigh and knee pain  Neurological:  Negative for dizziness, light-headedness, numbness and headaches.       Memory concerns    Per HPI unless specifically indicated above     Objective:    BP 125/69   Pulse (!) 101   Temp 98 F (36.7 C) (Oral)   Wt 183 lb 12.8 oz (83.4 kg)   SpO2 97%   BMI 29.22 kg/m   Wt Readings from Last 3 Encounters:  11/01/22 183 lb 12.8 oz (83.4 kg)  09/01/22 180 lb (81.6 kg)  04/04/22 186 lb 9.6 oz (84.6 kg)    Physical Exam Vitals and nursing note reviewed.  Constitutional:      General: She is not in acute distress.    Appearance: Normal appearance. She is normal weight. She is not ill-appearing, toxic-appearing or diaphoretic.  HENT:     Head: Normocephalic.     Right Ear: External ear normal.     Left Ear: External ear normal.     Nose: Nose normal.     Mouth/Throat:     Mouth: Mucous membranes are moist.     Pharynx: Oropharynx is clear.  Eyes:     General:        Right eye: No discharge.        Left eye: No discharge.     Extraocular Movements: Extraocular movements intact.     Conjunctiva/sclera: Conjunctivae normal.  Pupils: Pupils are equal, round, and reactive to light.  Cardiovascular:     Rate and Rhythm: Normal rate and regular rhythm.     Heart sounds: No murmur heard. Pulmonary:     Effort: Pulmonary effort is normal. No respiratory distress.     Breath sounds: Normal breath sounds. No wheezing or rales.  Musculoskeletal:     Cervical back: Normal range of motion and neck supple.     Right lower leg: No edema.     Left lower leg: No edema.  Skin:    General: Skin is warm and dry.     Capillary Refill: Capillary refill takes less than 2 seconds.  Neurological:     General: No focal deficit present.     Mental  Status: She is alert and oriented to person, place, and time. Mental status is at baseline.  Psychiatric:        Mood and Affect: Mood normal.        Speech: Speech is delayed.        Behavior: Behavior normal.        Thought Content: Thought content normal.        Cognition and Memory: Cognition is impaired.        Judgment: Judgment normal.     Results for orders placed or performed in visit on 04/19/22  HM DIABETES EYE EXAM  Result Value Ref Range   HM Diabetic Eye Exam No Retinopathy No Retinopathy      Assessment & Plan:   Problem List Items Addressed This Visit       Cardiovascular and Mediastinum   Essential hypertension - Primary    Chronic.  Controlled.  Continue with current medication regimen of Amlodipine 10mg .  Refills sent today.  Labs ordered today.  Return to clinic in 6 months for reevaluation.  Call sooner if concerns arise.        Relevant Medications   amLODipine (NORVASC) 5 MG tablet   atorvastatin (LIPITOR) 20 MG tablet   Other Relevant Orders   Comp Met (CMET)   PAD (peripheral artery disease) (HCC)    Chronic. Controlled at this time.  Discussed symptoms to monitor for.       Relevant Medications   amLODipine (NORVASC) 5 MG tablet   atorvastatin (LIPITOR) 20 MG tablet     Endocrine   Diabetes mellitus type 2, uncomplicated (HCC)    Chronic.  Controlled.  Last A1c was 6.1%.  Foot exam and Microalbumin up dated today.  Controlled without medication.  Labs ordered today.  Return to clinic in 6 months for reevaluation.  Call sooner if concerns arise.        Relevant Medications   atorvastatin (LIPITOR) 20 MG tablet   Other Relevant Orders   HgB A1c   Microalbumin, Urine Waived     Other   Obesity    Recommended eating smaller high protein, low fat meals more frequently and exercising 30 mins a day 5 times a week with a goal of 10-15lb weight loss in the next 3 months.       Hypercholesteremia    Chronic.  Controlled.  Continue with  current medication regimen on Atorvastatin 20mg  daily.  Refills sent today.  Labs ordered today.  Return to clinic in 6 months for reevaluation.  Call sooner if concerns arise.        Relevant Medications   amLODipine (NORVASC) 5 MG tablet   atorvastatin (LIPITOR) 20 MG tablet   Other Relevant Orders  Lipid Profile   Aphasia    Will get MRI on June 5.  Sent Diazepam for patient to take prior to exam.  Will make recommendations based on imaging results.         Follow up plan: Return in about 3 months (around 02/01/2023) for HTN, HLD, DM2 FU.

## 2022-11-01 NOTE — Assessment & Plan Note (Signed)
Recommended eating smaller high protein, low fat meals more frequently and exercising 30 mins a day 5 times a week with a goal of 10-15lb weight loss in the next 3 months.  

## 2022-11-02 LAB — COMPREHENSIVE METABOLIC PANEL
ALT: 21 IU/L (ref 0–32)
AST: 26 IU/L (ref 0–40)
Albumin/Globulin Ratio: 1.8 (ref 1.2–2.2)
Albumin: 4.2 g/dL (ref 3.8–4.8)
Alkaline Phosphatase: 67 IU/L (ref 44–121)
BUN/Creatinine Ratio: 17 (ref 12–28)
BUN: 13 mg/dL (ref 8–27)
Bilirubin Total: 1.1 mg/dL (ref 0.0–1.2)
CO2: 24 mmol/L (ref 20–29)
Calcium: 9.1 mg/dL (ref 8.7–10.3)
Chloride: 104 mmol/L (ref 96–106)
Creatinine, Ser: 0.75 mg/dL (ref 0.57–1.00)
Globulin, Total: 2.4 g/dL (ref 1.5–4.5)
Glucose: 125 mg/dL — ABNORMAL HIGH (ref 70–99)
Potassium: 3.5 mmol/L (ref 3.5–5.2)
Sodium: 142 mmol/L (ref 134–144)
Total Protein: 6.6 g/dL (ref 6.0–8.5)
eGFR: 81 mL/min/{1.73_m2} (ref >59)

## 2022-11-02 LAB — LIPID PANEL
Chol/HDL Ratio: 2 ratio (ref 0.0–4.4)
Cholesterol, Total: 129 mg/dL (ref 100–199)
HDL: 65 mg/dL (ref >39)
LDL Chol Calc (NIH): 43 mg/dL (ref 0–99)
Triglycerides: 118 mg/dL (ref 0–149)
VLDL Cholesterol Cal: 21 mg/dL (ref 5–40)

## 2022-11-02 LAB — HEMOGLOBIN A1C
Est. average glucose Bld gHb Est-mCnc: 131 mg/dL
Hgb A1c MFr Bld: 6.2 % — ABNORMAL HIGH (ref 4.8–5.6)

## 2022-11-02 NOTE — Progress Notes (Signed)
Please let Carol Armstrong know that her lab work looks good.  Her A1c is well controlled at 6.2%.  Her liver, kidneys and electrolytes look good.  No other concerns at this time.  Continue with current medication regimen.  Follow up as discussed.

## 2022-11-23 ENCOUNTER — Ambulatory Visit
Admission: RE | Admit: 2022-11-23 | Discharge: 2022-11-23 | Disposition: A | Discharge status: Home / Self Care | Payer: Medicare Other | Source: Ambulatory Visit | Attending: Nurse Practitioner | Admitting: Nurse Practitioner

## 2022-11-23 ENCOUNTER — Ambulatory Visit: Payer: Medicare Other

## 2022-11-23 DIAGNOSIS — R4701 Aphasia: Secondary | ICD-10-CM

## 2022-11-23 DIAGNOSIS — R413 Other amnesia: Secondary | ICD-10-CM | POA: Diagnosis not present

## 2022-11-23 MED ORDER — GADOBUTROL 1 MMOL/ML IV SOLN
7.5000 mL | Freq: Once | INTRAVENOUS | Status: AC | PRN
  Administered 2022-11-23: 7.5 mL via INTRAVENOUS

## 2022-12-05 NOTE — Progress Notes (Signed)
Please let patient know that her MRI does show that she has dementia.  It has worsened since the last imaging she had.  However, there is no evidence of a present or past stroke.  Symptoms are likely related to dementia.

## 2022-12-07 NOTE — Progress Notes (Signed)
Pt given lab results per notes of K. Caren Griffins, NP from 12/05/22 on 12/07/22. Pt verbalized understanding. Patient's daughter Nita Sickle reports she is POA and is not listed on DPR. Unable to review results with daughter.   Daughter reports she would like her name added to [facility]. Advise to come to office and have patient sign permission. Questions for PCP is will medication be beneficial for patient to take to manage dementia and slow it down. Please advise.

## 2022-12-07 NOTE — Progress Notes (Signed)
I'm not able to talk about how to proceed without her being on the [facility].  Patient would have to come in and sign the paper and update the information.

## 2022-12-15 ENCOUNTER — Telehealth: Payer: Self-pay | Admitting: Nurse Practitioner

## 2022-12-15 NOTE — Telephone Encounter (Signed)
Copied from CRM [id number]. Topic: General - Inquiry >> Dec 15, 2022  9:01 AM Haroldine Laws wrote: Reason for CRM: Pt's daughter Nita Sickle called asking what is the next step after the MRI for her mom.  She is noticing the dementia is progressing  [phone]  She is leaving to go back home to [place] tomorrow.

## 2022-12-15 NOTE — Telephone Encounter (Signed)
We are not able to discuss with her daughter due to the [facility] not having her name on it.  I recommend they come in before she goes back to [place] and update the DPR.  If you are able to talk to Ms. Corrie Dandy I recommend she see Neurology for the dementia.

## 2022-12-19 ENCOUNTER — Other Ambulatory Visit: Payer: Self-pay | Admitting: Nurse Practitioner

## 2022-12-19 DIAGNOSIS — Z1231 Encounter for screening mammogram for malignant neoplasm of breast: Secondary | ICD-10-CM

## 2022-12-19 NOTE — Telephone Encounter (Signed)
Called and spoke to the patient. Explained that her daughter contacted [place] regarding her dementia worsening. I explained to the patient that we cannot speak with her daughter as she has not given [place] permission to do so. I explained that next time she comes in, we can provide her a form to complete to be able to speak with her daughter. Patient states that she does not want to see Neurology at this time.

## 2023-01-18 ENCOUNTER — Telehealth: Payer: Self-pay | Admitting: Nurse Practitioner

## 2023-01-18 NOTE — Telephone Encounter (Signed)
Pt would like to switch her meds, amLODipine (NORVASC) 5 MG tablet  atorvastatin (LIPITOR) 20 MG tablet

## 2023-01-19 ENCOUNTER — Other Ambulatory Visit: Payer: Self-pay

## 2023-01-23 ENCOUNTER — Ambulatory Visit
Admission: RE | Admit: 2023-01-23 | Discharge: 2023-01-23 | Disposition: A | Discharge status: Home / Self Care | Payer: Medicare Other | Source: Ambulatory Visit | Attending: Nurse Practitioner | Admitting: Nurse Practitioner

## 2023-01-23 DIAGNOSIS — Z1231 Encounter for screening mammogram for malignant neoplasm of breast: Secondary | ICD-10-CM | POA: Insufficient documentation

## 2023-01-30 ENCOUNTER — Other Ambulatory Visit: Payer: Self-pay | Admitting: Nurse Practitioner

## 2023-02-01 NOTE — Telephone Encounter (Signed)
Too soon Requested Prescriptions  Pending Prescriptions Disp Refills   amLODipine (NORVASC) 5 MG tablet [Pharmacy Med Name: AMLODIPINE BESYLATE 5MG  TABLETS] 90 tablet 1    Sig: TAKE 1 TABLET(5 MG) BY MOUTH DAILY     Cardiovascular: Calcium Channel Blockers 2 Passed - 01/30/2023  5:47 PM      Passed - Last BP in normal range    BP Readings from Last 1 Encounters:  11/01/22 125/69         Passed - Last Heart Rate in normal range    Pulse Readings from Last 1 Encounters:  11/01/22 (!) 101         Passed - Valid encounter within last 6 months    Recent Outpatient Visits           3 months ago Essential hypertension   [place] [facility] Larae Grooms, NP   5 months ago Aphasia   [place] [facility] Larae Grooms, NP   10 months ago Annual physical exam   [place] [facility] Larae Grooms, NP   1 year ago Type 2 diabetes mellitus without complication, without long-term current use of insulin (HCC)   [place] [facility] Larae Grooms, NP   1 year ago Essential hypertension   [place] [facility] Larae Grooms, NP       Future Appointments             In 1 month Larae Grooms, NP [facility] [facility]             atorvastatin (LIPITOR) 20 MG tablet [Pharmacy Med Name: ATORVASTATIN 20MG  TABLETS] 90 tablet 1    Sig: TAKE 1 TABLET(20 MG) BY MOUTH DAILY     Cardiovascular:  Antilipid - Statins Failed - 01/30/2023  5:47 PM      Failed - Lipid Panel in normal range within the last 12 months    Cholesterol, Total  Date Value Ref Range Status  11/01/2022 129 100 - 199 mg/dL Final   LDL Chol Calc (NIH)  Date Value Ref Range Status  11/01/2022 43 0 - 99 mg/dL Final   HDL  Date Value Ref Range Status  11/01/2022 65 >39 mg/dL Final   Triglycerides  Date Value Ref Range Status  11/01/2022 118 0 - 149 mg/dL Final         Passed - Patient is not  pregnant      Passed - Valid encounter within last 12 months    Recent Outpatient Visits           3 months ago Essential hypertension   [place] [facility] Larae Grooms, NP   5 months ago Aphasia   [place] [facility] Larae Grooms, NP   10 months ago Annual physical exam   [place] [facility] Larae Grooms, NP   1 year ago Type 2 diabetes mellitus without complication, without long-term current use of insulin (HCC)   [place] [facility] Larae Grooms, NP   1 year ago Essential hypertension    [facility] Larae Grooms, NP       Future Appointments             In 1 month Larae Grooms, NP  [facility]

## 2023-02-15 ENCOUNTER — Other Ambulatory Visit: Payer: Self-pay | Admitting: Nurse Practitioner

## 2023-02-15 NOTE — Telephone Encounter (Signed)
Medication Refill - Medication: amLODipine (NORVASC) 5 MG tablet, atorvastatin (LIPITOR) 20 MG tablet and diazepam (VALIUM) 5 MG tablet   Has the patient contacted their pharmacy? No.  Preferred Pharmacy (with phone number or street name):  [id number] - [place], [place] - [address] AT [facility] [address] [facility] Phone: [phone]  Fax: [phone]     Has the patient been seen for an appointment in the last year OR does the patient have an upcoming appointment? Yes.    Agent: Please be advised that RX refills may take up to 3 business days. We ask that you follow-up with your pharmacy.

## 2023-02-16 MED ORDER — AMLODIPINE BESYLATE 5 MG PO TABS: ORAL_TABLET | ORAL | 0 refills | Status: DC

## 2023-02-16 MED ORDER — ATORVASTATIN CALCIUM 20 MG PO TABS: ORAL_TABLET | ORAL | 0 refills | Status: DC

## 2023-02-16 NOTE — Telephone Encounter (Signed)
Pharmacy change Requested Prescriptions  Pending Prescriptions Disp Refills   amLODipine (NORVASC) 5 MG tablet 90 tablet 0    Sig: Take 1 tablet by mouth daily     Cardiovascular: Calcium Channel Blockers 2 Passed - 02/15/2023 10:57 AM      Passed - Last BP in normal range    BP Readings from Last 1 Encounters:  11/01/22 125/69         Passed - Last Heart Rate in normal range    Pulse Readings from Last 1 Encounters:  11/01/22 (!) 101         Passed - Valid encounter within last 6 months    Recent Outpatient Visits           3 months ago Essential hypertension   [place] [facility] Larae Grooms, NP   5 months ago Aphasia   [place] [facility] Larae Grooms, NP   10 months ago Annual physical exam   [place] [facility] Larae Grooms, NP   1 year ago Type 2 diabetes mellitus without complication, without long-term current use of insulin (HCC)   [place] [facility] Larae Grooms, NP   1 year ago Essential hypertension   [place] [place] [facility] Larae Grooms, NP       Future Appointments             In 1 month Larae Grooms, NP [place] [facility]             atorvastatin (LIPITOR) 20 MG tablet 90 tablet 0    Sig: TAKE 1 TABLET(20 MG) BY MOUTH DAILY     Cardiovascular:  Antilipid - Statins Failed - 02/15/2023 10:57 AM      Failed - Lipid Panel in normal range within the last 12 months    Cholesterol, Total  Date Value Ref Range Status  11/01/2022 129 100 - 199 mg/dL Final   LDL Chol Calc (NIH)  Date Value Ref Range Status  11/01/2022 43 0 - 99 mg/dL Final   HDL  Date Value Ref Range Status  11/01/2022 65 >39 mg/dL Final   Triglycerides  Date Value Ref Range Status  11/01/2022 118 0 - 149 mg/dL Final         Passed - Patient is not pregnant      Passed - Valid encounter within last 12 months    Recent Outpatient Visits            3 months ago Essential hypertension   [place] [facility] Larae Grooms, NP   5 months ago Aphasia   [place] [facility] Larae Grooms, NP   10 months ago Annual physical exam   [place] [facility] Larae Grooms, NP   1 year ago Type 2 diabetes mellitus without complication, without long-term current use of insulin (HCC)   [place] [facility] Larae Grooms, NP   1 year ago Essential hypertension   [place] [facility] Larae Grooms, NP       Future Appointments             In 1 month Larae Grooms, NP [place] [facility]             diazepam (VALIUM) 5 MG tablet 2 tablet 0    Sig: Take 1 tablet (5 mg total) by mouth as needed for anxiety. Take 1 before leaving home the morning  of imaging. Take second one right before walking into imaging appt.     Not Delegated - Psychiatry: Anxiolytics/Hypnotics 2 Failed - 02/15/2023 10:57 AM      Failed - This refill cannot be delegated      Failed - Urine Drug Screen completed in last 360 days      Passed - Patient is not pregnant      Passed - Valid encounter within last 6 months    Recent Outpatient Visits           3 months ago Essential hypertension   [place] [facility] Larae Grooms, NP   5 months ago Aphasia   [place] [facility] Larae Grooms, NP   10 months ago Annual physical exam   [place] [facility] Larae Grooms, NP   1 year ago Type 2 diabetes mellitus without complication, without long-term current use of insulin (HCC)   [place] [facility] Larae Grooms, NP   1 year ago Essential hypertension   [place] [facility] Larae Grooms, NP       Future Appointments             In 1 month Larae Grooms, NP  [facility]

## 2023-02-16 NOTE — Telephone Encounter (Signed)
Requested medication (s) are due for refill today: Yes  Requested medication (s) are on the active medication list: Yes  Last refill:  11/01/22 #2, 0RF  Future visit scheduled: Yes  Notes to clinic:  Unable to refill per protocol, cannot delegate.      Requested Prescriptions  Pending Prescriptions Disp Refills   diazepam (VALIUM) 5 MG tablet 2 tablet 0    Sig: Take 1 tablet (5 mg total) by mouth as needed for anxiety. Take 1 before leaving home the morning of imaging. Take second one right before walking into imaging appt.     Not Delegated - Psychiatry: Anxiolytics/Hypnotics 2 Failed - 02/15/2023 10:57 AM      Failed - This refill cannot be delegated      Failed - Urine Drug Screen completed in last 360 days      Passed - Patient is not pregnant      Passed - Valid encounter within last 6 months    Recent Outpatient Visits           3 months ago Essential hypertension   [place] [facility] Larae Grooms, NP   5 months ago Aphasia   [place] [facility] Larae Grooms, NP   10 months ago Annual physical exam   [place] [facility] Larae Grooms, NP   1 year ago Type 2 diabetes mellitus without complication, without long-term current use of insulin (HCC)   [place] [facility] Larae Grooms, NP   1 year ago Essential hypertension   [place] [facility] Larae Grooms, NP       Future Appointments             In 1 month Larae Grooms, NP [place] [facility]            Signed Prescriptions Disp Refills   amLODipine (NORVASC) 5 MG tablet 90 tablet 0    Sig: Take 1 tablet by mouth daily     Cardiovascular: Calcium Channel Blockers 2 Passed - 02/15/2023 10:57 AM      Passed - Last BP in normal range    BP Readings from Last 1 Encounters:  11/01/22 125/69         Passed - Last Heart Rate in normal range    Pulse Readings from Last 1  Encounters:  11/01/22 (!) 101         Passed - Valid encounter within last 6 months    Recent Outpatient Visits           3 months ago Essential hypertension   [place] [facility] Larae Grooms, NP   5 months ago Aphasia   [place] [facility] Larae Grooms, NP   10 months ago Annual physical exam   [place] [facility] Larae Grooms, NP   1 year ago Type 2 diabetes mellitus without complication, without long-term current use of insulin (HCC)   [place] [facility] Larae Grooms, NP   1 year ago Essential hypertension   [place] [facility] Larae Grooms, NP       Future Appointments             In 1 month Larae Grooms, NP  [facility]             atorvastatin (LIPITOR) 20 MG tablet 90 tablet 0    Sig: TAKE 1 TABLET(20 MG) BY MOUTH DAILY  Cardiovascular:  Antilipid - Statins Failed - 02/15/2023 10:57 AM      Failed - Lipid Panel in normal range within the last 12 months    Cholesterol, Total  Date Value Ref Range Status  11/01/2022 129 100 - 199 mg/dL Final   LDL Chol Calc (NIH)  Date Value Ref Range Status  11/01/2022 43 0 - 99 mg/dL Final   HDL  Date Value Ref Range Status  11/01/2022 65 >39 mg/dL Final   Triglycerides  Date Value Ref Range Status  11/01/2022 118 0 - 149 mg/dL Final         Passed - Patient is not pregnant      Passed - Valid encounter within last 12 months    Recent Outpatient Visits           3 months ago Essential hypertension   [place] [facility] Larae Grooms, NP   5 months ago Aphasia   [place] [facility] Larae Grooms, NP   10 months ago Annual physical exam   [place] [facility] Larae Grooms, NP   1 year ago Type 2 diabetes mellitus without complication, without long-term current use of insulin (HCC)   [place] [facility] Larae Grooms, NP   1 year ago Essential hypertension   [facility] Lar[facility] G[facility], NP       Future Appointments             In 1 month Larae Grooms, NP  [facility]

## 2023-02-28 DIAGNOSIS — R7309 Other abnormal glucose: Secondary | ICD-10-CM | POA: Diagnosis not present

## 2023-02-28 DIAGNOSIS — H2513 Age-related nuclear cataract, bilateral: Secondary | ICD-10-CM | POA: Diagnosis not present

## 2023-02-28 DIAGNOSIS — H524 Presbyopia: Secondary | ICD-10-CM | POA: Diagnosis not present

## 2023-02-28 DIAGNOSIS — H52223 Regular astigmatism, bilateral: Secondary | ICD-10-CM | POA: Diagnosis not present

## 2023-02-28 DIAGNOSIS — H5203 Hypermetropia, bilateral: Secondary | ICD-10-CM | POA: Diagnosis not present

## 2023-02-28 DIAGNOSIS — L980 Pyogenic granuloma: Secondary | ICD-10-CM | POA: Diagnosis not present

## 2023-02-28 LAB — HM DIABETES EYE EXAM

## 2023-03-02 ENCOUNTER — Telehealth: Payer: Self-pay | Admitting: Nurse Practitioner

## 2023-03-02 NOTE — Telephone Encounter (Signed)
Copied from CRM [id number]. Topic: Medicare AWV >> Mar 02, 2023  3:24 PM Payton Doughty wrote: Reason for CRM: LM 03/02/2023 to schedule AWV   Verlee Rossetti; Care Guide Ambulatory Clinical Support [place] [facility] Direct Dial: [phone]

## 2023-03-03 IMAGING — MG MM DIGITAL SCREENING BILAT W/ TOMO AND CAD
6 of 10 series · 6 of 30 positions shown · non-contrast
Comparison: Previous exam(s).

CLINICAL DATA: Screening.

EXAM:
DIGITAL SCREENING BILATERAL MAMMOGRAM WITH TOMOSYNTHESIS AND CAD
TECHNIQUE: Bilateral screening digital craniocaudal and mediolateral oblique
mammograms were obtained. Bilateral screening digital breast
tomosynthesis was performed. The images were evaluated with
computer-aided detection.

[R MLO synth-2D (1 of 2)]
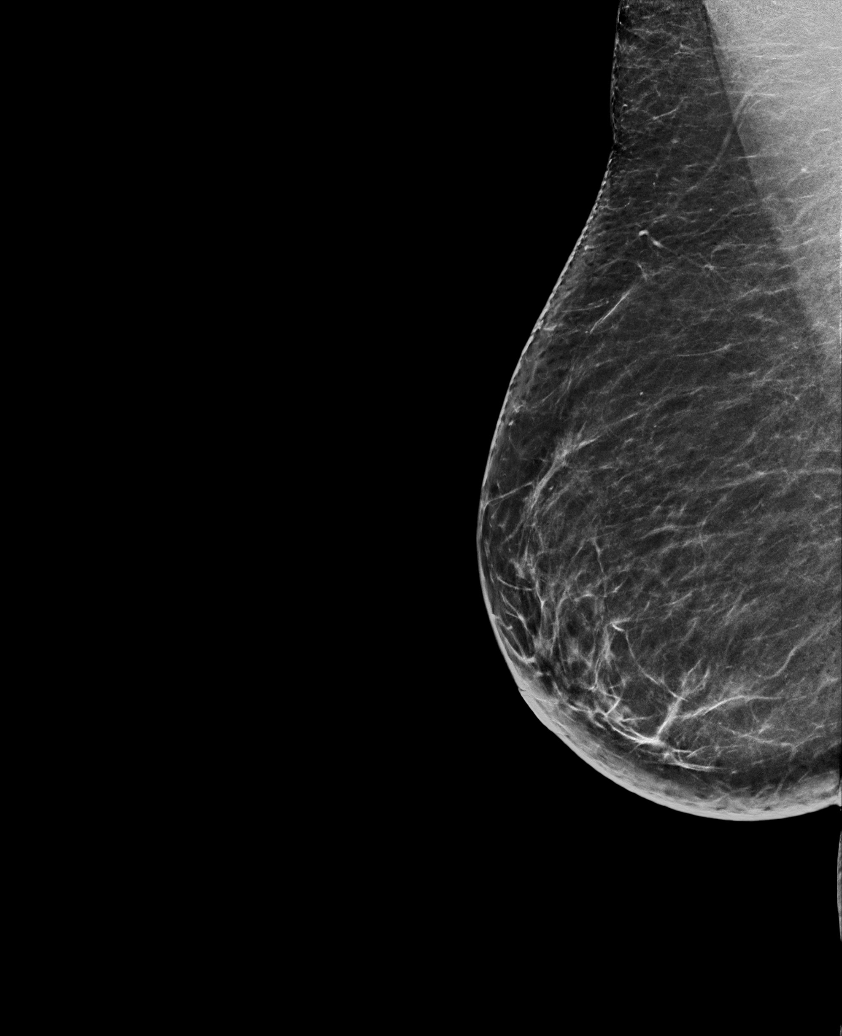

[L MLO synth-2D]
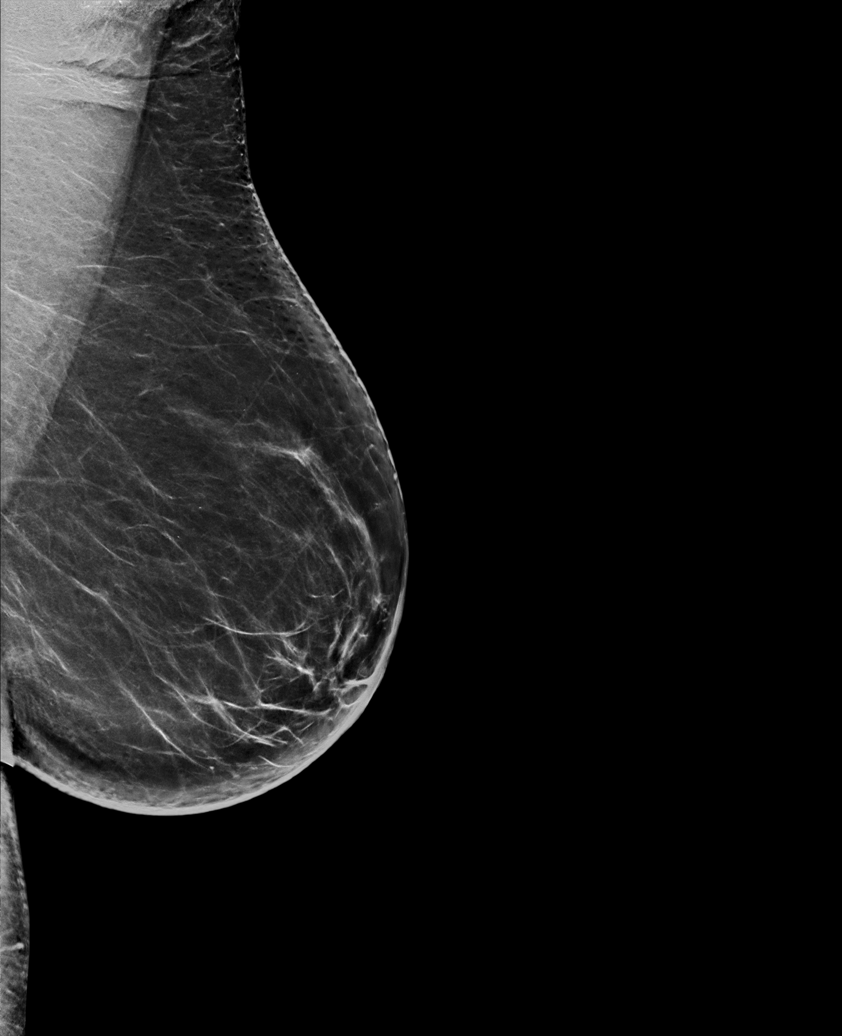

[L CC synth-2D]
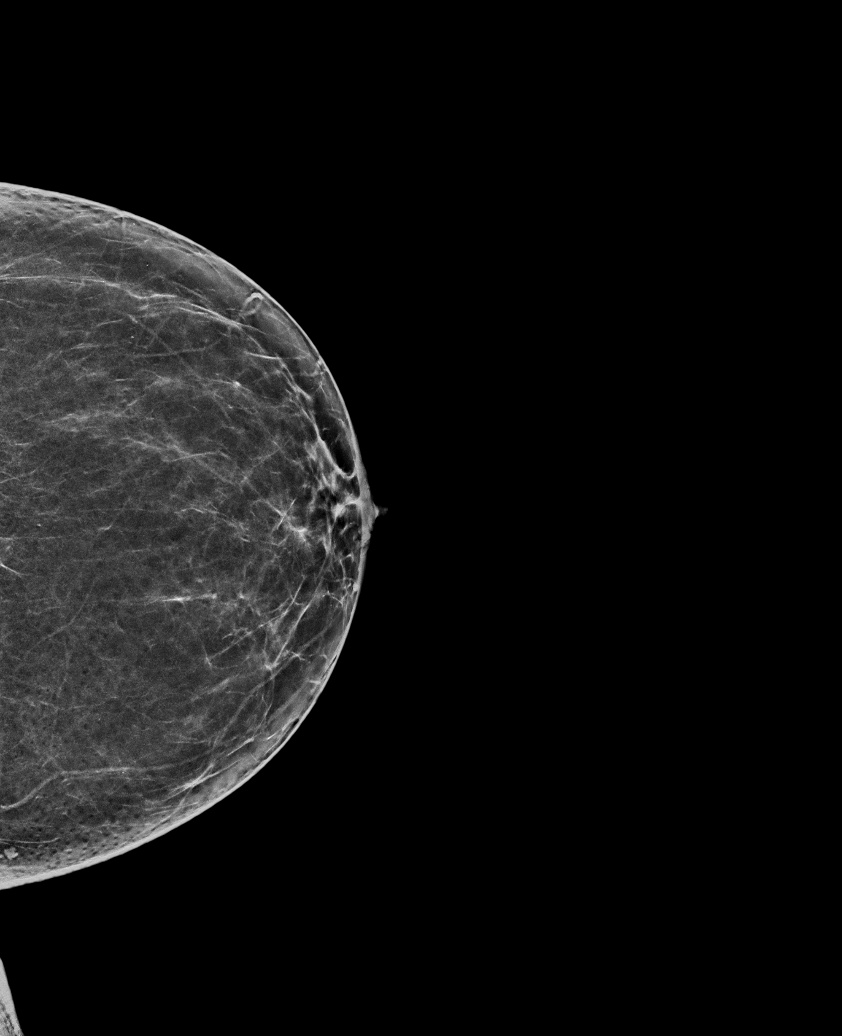

[R MLO synth-2D (2 of 2)]
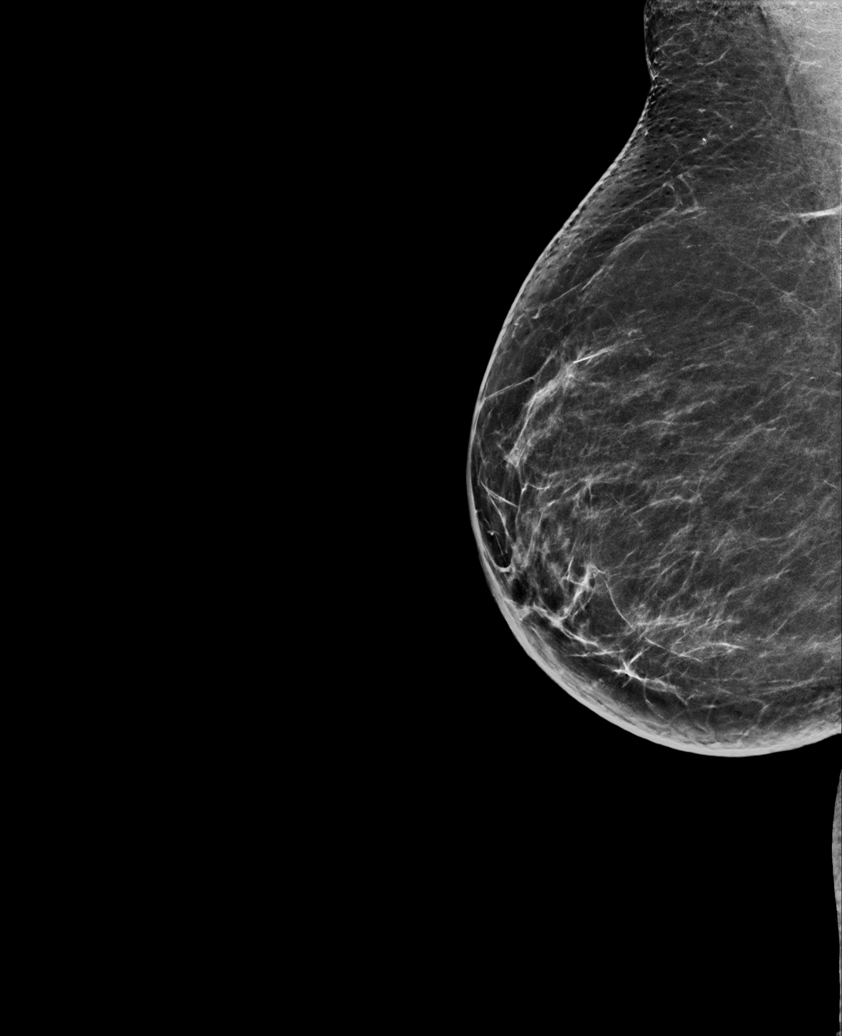

[R CC synth-2D]
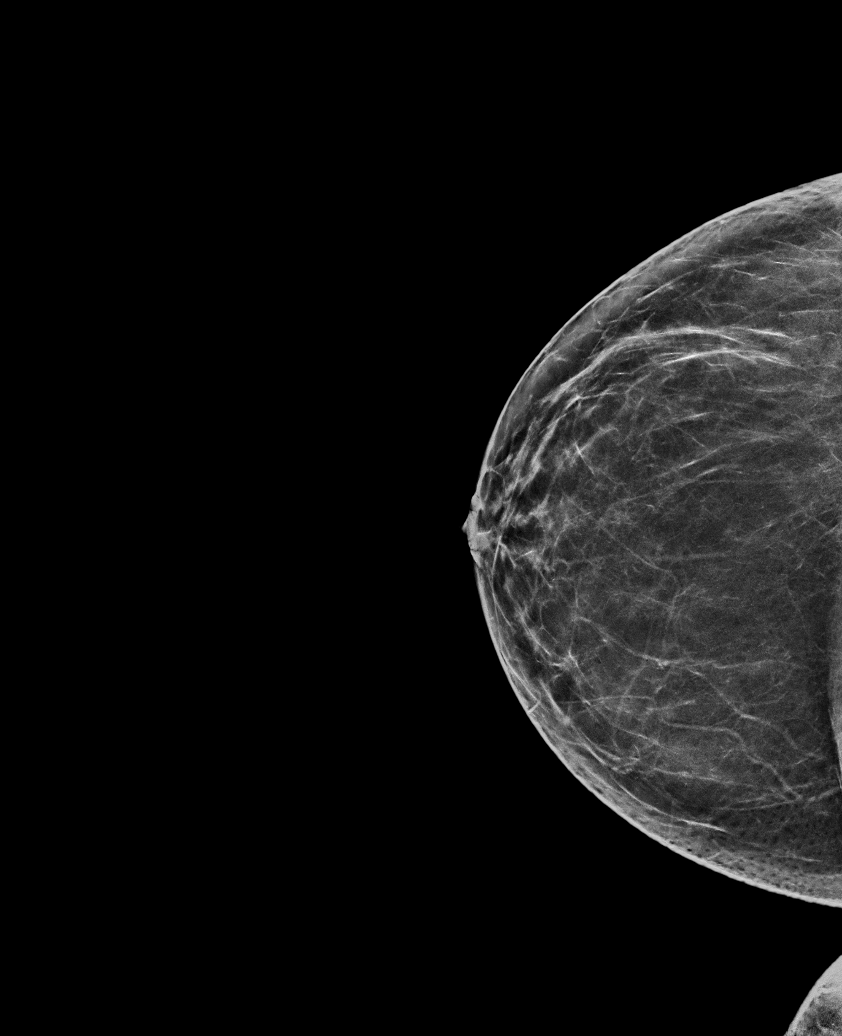

[L CC tomo · tomo slice 33/64.0]
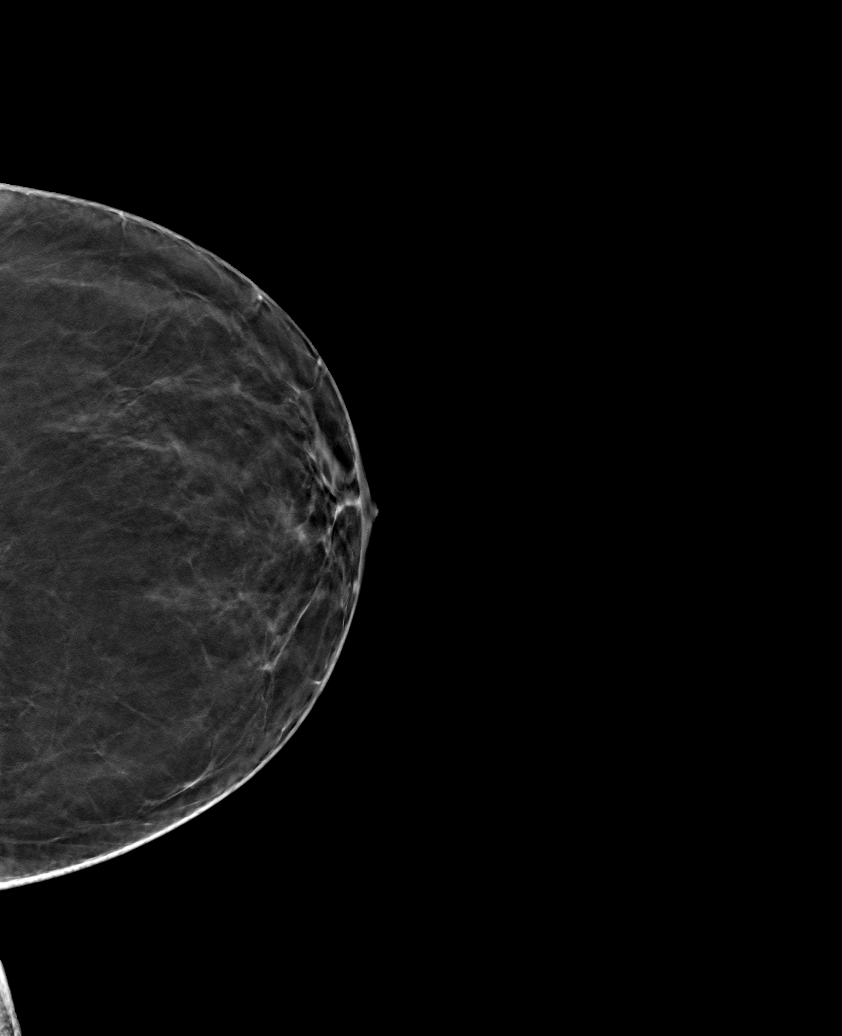

[6 of 30 positions shown; findings below may reference images not displayed]

ACR Breast Density Category b: There are scattered areas of
fibroglandular density.
FINDINGS: There are no findings suspicious for malignancy.
IMPRESSION: No mammographic evidence of malignancy. A result letter of this
screening mammogram will be mailed directly to the patient.

RECOMMENDATION:
Screening mammogram in one year. (Code:51-O-LD2)

BI-RADS CATEGORY  1: Negative.

## 2023-03-21 ENCOUNTER — Ambulatory Visit: Payer: Medicare Other | Admitting: Nurse Practitioner

## 2023-03-28 ENCOUNTER — Ambulatory Visit: Payer: Medicare Other | Admitting: Nurse Practitioner

## 2023-03-28 NOTE — Progress Notes (Deleted)
There were no vitals taken for this visit.   Subjective:    Patient ID: Carol Armstrong, female    DOB: [birth year], 82 y.o.   MRN: [id number]  HPI: Carol Armstrong is a 82 y.o. female presenting on 03/28/2023 for comprehensive medical examination. Current medical complaints include:{Blank single:19197::"none","***"}  She currently lives with: Menopausal Symptoms: {Blank single:19197::"yes","no"}  HYPERTENSION / HYPERLIPIDEMIA Satisfied with current treatment? {Blank single:19197::"yes","no"} Duration of hypertension: {Blank single:19197::"chronic","months","years"} BP monitoring frequency: {Blank single:19197::"not checking","rarely","daily","weekly","monthly","a few times a day","a few times a week","a few times a month"} BP range:  BP medication side effects: {Blank single:19197::"yes","no"} Past BP meds: {Blank multiple:19196::"none","amlodipine","amlodipine/benazepril","atenolol","benazepril","benazepril/HCTZ","bisoprolol (bystolic)","carvedilol","chlorthalidone","clonidine","diltiazem","exforge HCT","HCTZ","irbesartan (avapro)","labetalol","lisinopril","lisinopril-HCTZ","losartan (cozaar)","methyldopa","nifedipine","olmesartan (benicar)","olmesartan-HCTZ","quinapril","ramipril","spironalactone","tekturna","valsartan","valsartan-HCTZ","verapamil"} Duration of hyperlipidemia: {Blank single:19197::"chronic","months","years"} Cholesterol medication side effects: {Blank single:19197::"yes","no"} Cholesterol supplements: {Blank multiple:19196::"none","fish oil","niacin","red yeast rice"} Past cholesterol medications: {Blank multiple:19196::"none","atorvastain (lipitor)","lovastatin (mevacor)","pravastatin (pravachol)","rosuvastatin (crestor)","simvastatin (zocor)","vytorin","fenofibrate (tricor)","gemfibrozil","ezetimide (zetia)","niaspan","lovaza"} Medication compliance: {Blank single:19197::"excellent compliance","good compliance","fair compliance","poor compliance"} Aspirin: {Blank  single:19197::"yes","no"} Recent stressors: {Blank single:19197::"yes","no"} Recurrent headaches: {Blank single:19197::"yes","no"} Visual changes: {Blank single:19197::"yes","no"} Palpitations: {Blank single:19197::"yes","no"} Dyspnea: {Blank single:19197::"yes","no"} Chest pain: {Blank single:19197::"yes","no"} Lower extremity edema: {Blank single:19197::"yes","no"} Dizzy/lightheaded: {Blank single:19197::"yes","no"}  DIABETES Hypoglycemic episodes:{Blank single:19197::"yes","no"} Polydipsia/polyuria: {Blank single:19197::"yes","no"} Visual disturbance: {Blank single:19197::"yes","no"} Chest pain: {Blank single:19197::"yes","no"} Paresthesias: {Blank single:19197::"yes","no"} Glucose Monitoring: {Blank single:19197::"yes","no"}  Accucheck frequency: {Blank single:19197::"Not Checking","Daily","BID","TID"}  Fasting glucose:  Post prandial:  Evening:  Before meals: Taking Insulin?: {Blank single:19197::"yes","no"}  Long acting insulin:  Short acting insulin: Blood Pressure Monitoring: {Blank single:19197::"not checking","rarely","daily","weekly","monthly","a few times a day","a few times a week","a few times a month"} Retinal Examination: {Blank single:19197::"Up to Date","Not up to Date"} Foot Exam: {Blank single:19197::"Up to Date","Not up to Date"} Diabetic Education: {Blank single:19197::"Completed","Not Completed"} Pneumovax: {Blank single:19197::"Up to Date","Not up to Date","unknown"} Influenza: {Blank single:19197::"Up to Date","Not up to Date","unknown"} Aspirin: {Blank single:19197::"yes","no"}  Functional Status Survey:       04/04/2022    3:56 PM 02/14/2022   12:37 PM 12/27/2021    2:23 PM 02/12/2021    1:51 PM 02/10/2020   11:23 AM  Fall Risk   Falls in the past year? 0 0 0 0 0  Number falls in past yr: 0 0 0    Injury with Fall? 0 0 0    Risk for fall due to : No Fall Risks  No Fall Risks Medication side effect No Fall Risks  Follow up Falls evaluation  completed Falls evaluation completed;Education provided;Falls prevention discussed Falls evaluation completed Falls evaluation completed;Education provided;Falls prevention discussed Falls evaluation completed;Education provided;Falls prevention discussed    Depression Screen    11/01/2022    3:04 PM 04/04/2022    3:56 PM 02/14/2022   12:45 PM 12/27/2021    2:23 PM 02/12/2021    1:51 PM  Depression screen PHQ 2/9  Decreased Interest 0 0 0 0 0  Down, Depressed, Hopeless 0 0 0 0 0  PHQ - 2 Score 0 0 0 0 0  Altered sleeping 1 0 0 0   Tired, decreased energy 0 0 0 0   Change in appetite 0 0 0 0   Feeling bad or failure about yourself  0 0 0 0   Trouble concentrating 0 0 0 0   Moving slowly or fidgety/restless 0 0 0 0   Suicidal thoughts 0 0 0 0   PHQ-9 Score 1 0 0 0   Difficult doing work/chores Not difficult at all Not difficult at all Not difficult at all Not difficult at all      Advanced Directives Does patient have a HCPOA?    {Blank single:19197::"yes","no"} If  yes, name and contact information:  Does patient have a living will or MOST form?  {Blank single:19197::"yes","no"}  Past Medical History:  Past Medical History:  Diagnosis Date   Hypertension    Prediabetes    Wears dentures    partial upper and lower    Surgical History:  Past Surgical History:  Procedure Laterality Date   COLONOSCOPY     COLONOSCOPY WITH PROPOFOL N/A 10/07/2019   Procedure: COLONOSCOPY WITH PROPOFOL;  Surgeon: Midge Minium, MD;  Location: [facility];  Service: Endoscopy;  Laterality: N/A;  Latex  priority 4   POLYPECTOMY  10/07/2019   Procedure: POLYPECTOMY;  Surgeon: Midge Minium, MD;  Location: [facility];  Service: Endoscopy;;   TUBAL LIGATION      Medications:  Current Outpatient Medications on File Prior to Visit  Medication Sig   amLODipine (NORVASC) 5 MG tablet Take 1 tablet by mouth daily   atorvastatin (LIPITOR) 20 MG tablet TAKE 1 TABLET(20 MG) BY MOUTH DAILY    diazepam (VALIUM) 5 MG tablet Take 1 tablet (5 mg total) by mouth as needed for anxiety. Take 1 before leaving home the morning of imaging. Take second one right before walking into imaging appt.   Multiple Vitamin (MULTIVITAMIN) capsule Take 1 capsule by mouth daily.   No current facility-administered medications on file prior to visit.    Allergies:  Allergies  Allergen Reactions   Latex Itching and Swelling    When gloves are WORN    Social History:  Social History   Socioeconomic History   Marital status: Divorced    Spouse name: Not on file   Number of children: Not on file   Years of education: Not on file   Highest education level: Not on file  Occupational History   Occupation: retired  Tobacco Use   Smoking status: Former   Smokeless tobacco: Never   Tobacco comments:    as teenager  Advertising account planner   Vaping status: Never Used  Substance and Sexual Activity   Alcohol use: Not Currently    Comment: occasinally wine   Drug use: Never   Sexual activity: Not Currently  Other Topics Concern   Not on file  Social History Narrative   Not on file   Social Determinants of Health   Financial Resource Strain: Low Risk  (02/14/2022)   Overall Financial Resource Strain (CARDIA)    Difficulty of Paying Living Expenses: Not hard at all  Food Insecurity: No Food Insecurity (02/14/2022)   Hunger Vital Sign    Worried About Running Out of Food in the Last Year: Never true    Ran Out of Food in the Last Year: Never true  Transportation Needs: No Transportation Needs (02/14/2022)   PRAPARE - Administrator, Civil Service (Medical): No    Lack of Transportation (Non-Medical): No  Physical Activity: Inactive (02/14/2022)   Exercise Vital Sign    Days of Exercise per Week: 0 days    Minutes of Exercise per Session: 0 min  Stress: No Stress Concern Present (02/14/2022)   [employer] of Occupational Health - Occupational Stress Questionnaire    Feeling of Stress  : Not at all  Social Connections: Moderately Isolated (02/14/2022)   Social Connection and Isolation Panel [NHANES]    Frequency of Communication with Friends and Family: More than three times a week    Frequency of Social Gatherings with Friends and Family: Twice a week    Attends Religious Services: More than  4 times per year    Active Member of Clubs or Organizations: No    Attends Banker Meetings: Never    Marital Status: Widowed  Intimate Partner Violence: Not At Risk (02/14/2022)   Humiliation, Afraid, Rape, and Kick questionnaire    Fear of Current or Ex-Partner: No    Emotionally Abused: No    Physically Abused: No    Sexually Abused: No   Social History   Tobacco Use  Smoking Status Former  Smokeless Tobacco Never  Tobacco Comments   as teenager   Social History   Substance and Sexual Activity  Alcohol Use Not Currently   Comment: occasinally wine    Family History:  Family History  Problem Relation Age of Onset   Hypertension Mother    Prostate cancer Father    Hypertension Sister    Hypertension Brother    Hypertension Sister    Hypertension Sister    Hypertension Sister    Breast cancer Neg Hx     Past medical history, surgical history, medications, allergies, family history and social history reviewed with patient today and changes made to appropriate areas of the chart.   ROS  All other ROS negative except what is listed above and in the HPI.      Objective:    There were no vitals taken for this visit.  Wt Readings from Last 3 Encounters:  11/01/22 183 lb 12.8 oz (83.4 kg)  09/01/22 180 lb (81.6 kg)  04/04/22 186 lb 9.6 oz (84.6 kg)    No results found.  Physical Exam     02/14/2022   12:38 PM 02/12/2021    1:53 PM 02/10/2020   11:25 AM  6CIT Screen  What Year? 0 points 0 points 0 points  What month? 0 points 0 points 0 points  What time? 0 points 3 points 0 points  Count back from 20 2 points 0 points 4 points  Months  in reverse 4 points 2 points 2 points  Repeat phrase 4 points 8 points 4 points  Total Score 10 points 13 points 10 points    Cognitive Testing - 6-CIT  Correct? Score   What year is it? {YES NO:22349} {Numbers; 0-4:31231} Yes = 0    No = 4  What month is it? {YES NO:22349} {Numbers; 0-4:31231} Yes = 0    No = 3  Remember:     Floyde Parkins, [address], [place]     What time is it? {YES NO:22349} {Numbers; 0-4:31231} Yes = 0    No = 3  Count backwards from 20 to 1 {YES NO:22349} {Numbers; 0-4:31231} Correct = 0    1 error = 2   More than 1 error = 4  Say the months of the year in reverse. {YES NO:22349} {Numbers; 0-4:31231} Correct = 0    1 error = 2   More than 1 error = 4  What address did I ask you to remember? {YES NO:22349} {NUMBERS; 0-10:5044} Correct = 0  1 error = 2    2 error = 4    3 error = 6    4 error = 8    All wrong = 10       TOTAL SCORE  {Numbers; 3-22:02542}/70   Interpretation:  {Desc; normal/abnormal:11317::"Normal"}  Normal (0-7) Abnormal (8-28)   Results for orders placed or performed in visit on 03/01/23  HM DIABETES EYE EXAM  Result Value Ref Range   HM Diabetic  Eye Exam No Retinopathy No Retinopathy      Assessment & Plan:   Problem List Items Addressed This Visit       Cardiovascular and Mediastinum   Essential hypertension   PAD (peripheral artery disease) (HCC) - Primary     Endocrine   Diabetes mellitus type 2, uncomplicated (HCC)     Other   Obesity     Preventative Services:  AAA screening:  Health Risk Assessment and Personalized Prevention Plan: Bone Mass Measurements: Breast Cancer Screening: CVD Screening:  Cervical Cancer Screening: Colon Cancer Screening:  Depression Screening:  Diabetes Screening:  Glaucoma Screening:  Hepatitis B vaccine: Hepatitis C screening:  HIV Screening: Flu Vaccine: Lung cancer Screening: Obesity Screening:  Pneumonia Vaccines (2): STI Screening:  Follow up plan: No follow-ups on  file.   LABORATORY TESTING:  - Pap smear: {Blank single:19197::"pap done","not applicable","up to date","done elsewhere"}  IMMUNIZATIONS:   - Tdap: Tetanus vaccination status reviewed: {tetanus status:315746}. - Influenza: {Blank single:19197::"Up to date","Administered today","Postponed to flu season","Refused","Given elsewhere"} - Pneumovax: {Blank single:19197::"Up to date","Administered today","Not applicable","Refused","Given elsewhere"} - Prevnar: {Blank single:19197::"Up to date","Administered today","Not applicable","Refused","Given elsewhere"} - Zostavax vaccine: {Blank single:19197::"Up to date","Administered today","Not applicable","Refused","Given elsewhere"}  SCREENING: -Mammogram: {Blank single:19197::"Up to date","Ordered today","Not applicable","Refused","Done elsewhere"}  - Colonoscopy: {Blank single:19197::"Up to date","Ordered today","Not applicable","Refused","Done elsewhere"}  - Bone Density: {Blank single:19197::"Up to date","Ordered today","Not applicable","Refused","Done elsewhere"}  -Hearing Test: {Blank single:19197::"Up to date","Ordered today","Not applicable","Refused","Done elsewhere"}  -Spirometry: {Blank single:19197::"Up to date","Ordered today","Not applicable","Refused","Done elsewhere"}   PATIENT COUNSELING:   Advised to take 1 mg of folate supplement per day if capable of pregnancy.   Sexuality: Discussed sexually transmitted diseases, partner selection, use of condoms, avoidance of unintended pregnancy  and contraceptive alternatives.   Advised to avoid cigarette smoking.  I discussed with the patient that most people either abstain from alcohol or drink within safe limits (<=14/week and <=4 drinks/occasion for males, <=7/weeks and <= 3 drinks/occasion for females) and that the risk for alcohol disorders and other health effects rises proportionally with the number of drinks per week and how often a drinker exceeds daily limits.  Discussed  cessation/primary prevention of drug use and availability of treatment for abuse.   Diet: Encouraged to adjust caloric intake to maintain  or achieve ideal body weight, to reduce intake of dietary saturated fat and total fat, to limit sodium intake by avoiding high sodium foods and not adding table salt, and to maintain adequate dietary potassium and calcium preferably from fresh fruits, vegetables, and low-fat dairy products.    stressed the importance of regular exercise  Injury prevention: Discussed safety belts, safety helmets, smoke detector, smoking near bedding or upholstery.   Dental health: Discussed importance of regular tooth brushing, flossing, and dental visits.    NEXT PREVENTATIVE PHYSICAL DUE IN 1 YEAR. No follow-ups on file.

## 2023-04-03 DIAGNOSIS — H2513 Age-related nuclear cataract, bilateral: Secondary | ICD-10-CM | POA: Diagnosis not present

## 2023-04-03 DIAGNOSIS — I10 Essential (primary) hypertension: Secondary | ICD-10-CM | POA: Diagnosis not present

## 2023-04-03 DIAGNOSIS — H25013 Cortical age-related cataract, bilateral: Secondary | ICD-10-CM | POA: Diagnosis not present

## 2023-04-03 DIAGNOSIS — H25043 Posterior subcapsular polar age-related cataract, bilateral: Secondary | ICD-10-CM | POA: Diagnosis not present

## 2023-04-04 ENCOUNTER — Encounter: Payer: Self-pay | Admitting: Nurse Practitioner

## 2023-04-04 ENCOUNTER — Ambulatory Visit: Payer: Medicare Other | Admitting: Nurse Practitioner

## 2023-04-04 VITALS — BP 135/80 | HR 86 | Temp 98.0°F | Ht 66.0 in | Wt 179.0 lb

## 2023-04-04 DIAGNOSIS — Z23 Encounter for immunization: Secondary | ICD-10-CM

## 2023-04-04 DIAGNOSIS — Z0000 Encounter for general adult medical examination without abnormal findings: Secondary | ICD-10-CM | POA: Diagnosis not present

## 2023-04-04 DIAGNOSIS — I739 Peripheral vascular disease, unspecified: Secondary | ICD-10-CM

## 2023-04-04 DIAGNOSIS — E6609 Other obesity due to excess calories: Secondary | ICD-10-CM

## 2023-04-04 DIAGNOSIS — E669 Obesity, unspecified: Secondary | ICD-10-CM

## 2023-04-04 DIAGNOSIS — G3184 Mild cognitive impairment, so stated: Secondary | ICD-10-CM | POA: Diagnosis not present

## 2023-04-04 DIAGNOSIS — E66811 Obesity, class 1: Secondary | ICD-10-CM

## 2023-04-04 DIAGNOSIS — I10 Essential (primary) hypertension: Secondary | ICD-10-CM

## 2023-04-04 DIAGNOSIS — Z6828 Body mass index (BMI) 28.0-28.9, adult: Secondary | ICD-10-CM

## 2023-04-04 DIAGNOSIS — Z7189 Other specified counseling: Secondary | ICD-10-CM | POA: Diagnosis not present

## 2023-04-04 DIAGNOSIS — E119 Type 2 diabetes mellitus without complications: Secondary | ICD-10-CM

## 2023-04-04 DIAGNOSIS — E7800 Pure hypercholesterolemia, unspecified: Secondary | ICD-10-CM | POA: Diagnosis not present

## 2023-04-04 LAB — MICROALBUMIN, URINE WAIVED
Creatinine, Urine Waived: 300 mg/dL (ref 10–300)
Microalb, Ur Waived: 80 mg/L — ABNORMAL HIGH (ref 0–19)

## 2023-04-04 MED ORDER — AMLODIPINE BESYLATE 5 MG PO TABS: ORAL_TABLET | ORAL | 1 refills | Status: DC

## 2023-04-04 MED ORDER — ATORVASTATIN CALCIUM 20 MG PO TABS: ORAL_TABLET | ORAL | 1 refills | Status: DC

## 2023-04-04 NOTE — Assessment & Plan Note (Signed)
Chronic.  Cancelled appt with Neurology.  Decided she didn't want to go.  Discussed starting anticholinesterase inhibitor today.  Follow up in 3 months.  Call sooner if concerns arise.

## 2023-04-04 NOTE — Assessment & Plan Note (Signed)
Chronic.  Controlled.  Continue with current medication regimen on Atorvastatin 20mg  daily.  Refills sent today.  Labs ordered today.  Return to clinic in 3 months for reevaluation.  Call sooner if concerns arise.

## 2023-04-04 NOTE — Assessment & Plan Note (Signed)
Recommended eating smaller high protein, low fat meals more frequently and exercising 30 mins a day 5 times a week with a goal of 10-15lb weight loss in the next 3 months.  

## 2023-04-04 NOTE — Assessment & Plan Note (Signed)
Chronic. Controlled at this time.  Discussed symptoms to monitor for and when to seek follow up.

## 2023-04-04 NOTE — Assessment & Plan Note (Signed)
Chronic.  Controlled.  Continue with current medication regimen of Amlodipine 10mg .  Recommend checking blood pressures at home.  Refills sent today.  Labs ordered today.  Return to clinic in 6 months for reevaluation.  Call sooner if concerns arise.

## 2023-04-04 NOTE — Progress Notes (Signed)
BP 135/80   Pulse 86   Temp 98 F (36.7 C) (Oral)   Ht 5\' 6"  (1.676 m)   Wt 179 lb (81.2 kg)   SpO2 98%   BMI 28.89 kg/m    Subjective:    Patient ID: Carol Armstrong, female    DOB: 10/09/1940, 82 y.o.   MRN: [id number]  HPI: Carol Armstrong is a 82 y.o. female presenting on 04/04/2023 for comprehensive medical examination. Current medical complaints include:none  She currently lives with: Menopausal Symptoms: no  HYPERTENSION / HYPERLIPIDEMIA Satisfied with current treatment? yes Duration of hypertension: years BP monitoring frequency: not checking BP range:  BP medication side effects: no Past BP meds: amlodipine Duration of hyperlipidemia: years Cholesterol medication side effects: no Cholesterol supplements: none Past cholesterol medications: atorvastain (lipitor) Medication compliance: excellent compliance Aspirin: no Recent stressors: no Recurrent headaches: no Visual changes: no Palpitations: no Dyspnea: no Chest pain: no Lower extremity edema: no Dizzy/lightheaded: no  DIABETES Hypoglycemic episodes:no Polydipsia/polyuria: no Visual disturbance: no Chest pain: no Paresthesias: no Glucose Monitoring: no  Accucheck frequency: Not Checking  Fasting glucose:  Post prandial:  Evening:  Before meals: Taking Insulin?: no  Long acting insulin:  Short acting insulin: Blood Pressure Monitoring: not checking Retinal Examination: Up to Date Foot Exam: Up to Date Diabetic Education: Not Completed Pneumovax: Not up to Date Influenza: Not up to Date Aspirin: no     09/01/2022    4:48 PM 05/28/2021   11:38 AM 09/17/2020    2:56 PM  MMSE - Mini Mental State Exam  Orientation to time 3 5 5   Orientation to Place 5 5 5   Registration 3 1 3   Attention/ Calculation 0 0 0  Recall 3 2 2   Language- name 2 objects 2 2 2   Language- repeat 1 1 1   Language- follow 3 step command 3 3 3   Language- read & follow direction 1 1 1   Write a sentence 0 1 1  Copy design  0 0 0  Total score 21 21 23      Functional Status Survey: Is the patient deaf or have difficulty hearing?: No Does the patient have difficulty seeing, even when wearing glasses/contacts?: No Does the patient have difficulty concentrating, remembering, or making decisions?: No Does the patient have difficulty walking or climbing stairs?: No Does the patient have difficulty dressing or bathing?: No Does the patient have difficulty doing errands alone such as visiting a doctor's office or shopping?: Yes     04/04/2023    1:30 PM 04/04/2022    3:56 PM 02/14/2022   12:37 PM 12/27/2021    2:23 PM 02/12/2021    1:51 PM  Fall Risk   Falls in the past year? 0 0 0 0 0  Number falls in past yr: 0 0 0 0   Injury with Fall? 0 0 0 0   Risk for fall due to : No Fall Risks No Fall Risks  No Fall Risks Medication side effect  Follow up Falls evaluation completed Falls evaluation completed Falls evaluation completed;Education provided;Falls prevention discussed Falls evaluation completed Falls evaluation completed;Education provided;Falls prevention discussed    Depression Screen    04/04/2023    1:30 PM 11/01/2022    3:04 PM 04/04/2022    3:56 PM 02/14/2022   12:45 PM 12/27/2021    2:23 PM  Depression screen PHQ 2/9  Decreased Interest 0 0 0 0 0  Down, Depressed, Hopeless 0 0 0 0 0  PHQ - 2  Score 0 0 0 0 0  Altered sleeping 0 1 0 0 0  Tired, decreased energy 0 0 0 0 0  Change in appetite 0 0 0 0 0  Feeling bad or failure about yourself  0 0 0 0 0  Trouble concentrating 0 0 0 0 0  Moving slowly or fidgety/restless 0 0 0 0 0  Suicidal thoughts 0 0 0 0 0  PHQ-9 Score 0 1 0 0 0  Difficult doing work/chores Not difficult at all Not difficult at all Not difficult at all Not difficult at all Not difficult at all     Advanced Directives Does patient have a HCPOA?    yes If yes, name and contact information: Her Daughter Does patient have a living will or MOST form?  yes  Past Medical  History:  Past Medical History:  Diagnosis Date   Hypertension    Prediabetes    Wears dentures    partial upper and lower    Surgical History:  Past Surgical History:  Procedure Laterality Date   COLONOSCOPY     COLONOSCOPY WITH PROPOFOL N/A 10/07/2019   Procedure: COLONOSCOPY WITH PROPOFOL;  Surgeon: Midge Minium, MD;  Location: [facility];  Service: Endoscopy;  Laterality: N/A;  Latex  priority 4   POLYPECTOMY  10/07/2019   Procedure: POLYPECTOMY;  Surgeon: Midge Minium, MD;  Location: [facility];  Service: Endoscopy;;   TUBAL LIGATION      Medications:  Current Outpatient Medications on File Prior to Visit  Medication Sig   Multiple Vitamin (MULTIVITAMIN) capsule Take 1 capsule by mouth daily.   No current facility-administered medications on file prior to visit.    Allergies:  Allergies  Allergen Reactions   Latex Itching and Swelling    When gloves are WORN    Social History:  Social History   Socioeconomic History   Marital status: Divorced    Spouse name: Not on file   Number of children: Not on file   Years of education: Not on file   Highest education level: Not on file  Occupational History   Occupation: retired  Tobacco Use   Smoking status: Former   Smokeless tobacco: Never   Tobacco comments:    as teenager  Advertising account planner   Vaping status: Never Used  Substance and Sexual Activity   Alcohol use: Not Currently    Comment: occasinally wine   Drug use: Never   Sexual activity: Not Currently  Other Topics Concern   Not on file  Social History Narrative   Not on file   Social Determinants of Health   Financial Resource Strain: Low Risk  (04/04/2023)   Overall Financial Resource Strain (CARDIA)    Difficulty of Paying Living Expenses: Not hard at all  Food Insecurity: No Food Insecurity (04/04/2023)   Hunger Vital Sign    Worried About Running Out of Food in the Last Year: Never true    Ran Out of Food in the Last Year: Never  true  Transportation Needs: No Transportation Needs (04/04/2023)   PRAPARE - Administrator, Civil Service (Medical): No    Lack of Transportation (Non-Medical): No  Physical Activity: Inactive (04/04/2023)   Exercise Vital Sign    Days of Exercise per Week: 0 days    Minutes of Exercise per Session: 0 min  Stress: No Stress Concern Present (04/04/2023)   [employer] of Occupational Health - Occupational Stress Questionnaire    Feeling of Stress :  Only a little  Social Connections: Moderately Isolated (02/14/2022)   Social Connection and Isolation Panel [NHANES]    Frequency of Communication with Friends and Family: More than three times a week    Frequency of Social Gatherings with Friends and Family: Twice a week    Attends Religious Services: More than 4 times per year    Active Member of [employer] or Organizations: No    Attends Banker Meetings: Never    Marital Status: Widowed  Intimate Partner Violence: Not At Risk (04/04/2023)   Humiliation, Afraid, Rape, and Kick questionnaire    Fear of Current or Ex-Partner: No    Emotionally Abused: No    Physically Abused: No    Sexually Abused: No   Social History   Tobacco Use  Smoking Status Former  Smokeless Tobacco Never  Tobacco Comments   as teenager   Social History   Substance and Sexual Activity  Alcohol Use Not Currently   Comment: occasinally wine    Family History:  Family History  Problem Relation Age of Onset   Hypertension Mother    Prostate cancer Father    Hypertension Sister    Hypertension Brother    Hypertension Sister    Hypertension Sister    Hypertension Sister    Breast cancer Neg Hx     Past medical history, surgical history, medications, allergies, family history and social history reviewed with patient today and changes made to appropriate areas of the chart.   Review of Systems  HENT:         Denies vision changes.  Eyes:  Negative for blurred vision and  double vision.  Respiratory:  Negative for shortness of breath.   Cardiovascular:  Negative for chest pain, palpitations and leg swelling.  Neurological:  Negative for dizziness, tingling and headaches.  Endo/Heme/Allergies:  Negative for polydipsia.       Denies Polyuria    All other ROS negative except what is listed above and in the HPI.      Objective:    BP 135/80   Pulse 86   Temp 98 F (36.7 C) (Oral)   Ht 5\' 6"  (1.676 m)   Wt 179 lb (81.2 kg)   SpO2 98%   BMI 28.89 kg/m   Wt Readings from Last 3 Encounters:  04/04/23 179 lb (81.2 kg)  11/01/22 183 lb 12.8 oz (83.4 kg)  09/01/22 180 lb (81.6 kg)    No results found.  Physical Exam Vitals and nursing note reviewed.  Constitutional:      General: She is awake. She is not in acute distress.    Appearance: Normal appearance. She is well-developed. She is not ill-appearing.  HENT:     Head: Normocephalic and atraumatic.     Right Ear: Hearing, tympanic membrane, ear canal and external ear normal. No drainage.     Left Ear: Hearing, tympanic membrane, ear canal and external ear normal. No drainage.     Nose: Nose normal.     Right Sinus: No maxillary sinus tenderness or frontal sinus tenderness.     Left Sinus: No maxillary sinus tenderness or frontal sinus tenderness.     Mouth/Throat:     Mouth: Mucous membranes are moist.     Pharynx: Oropharynx is clear. Uvula midline. No pharyngeal swelling, oropharyngeal exudate or posterior oropharyngeal erythema.  Eyes:     General: Lids are normal.        Right eye: No discharge.  Left eye: No discharge.     Extraocular Movements: Extraocular movements intact.     Conjunctiva/sclera: Conjunctivae normal.     Pupils: Pupils are equal, round, and reactive to light.     Visual Fields: Right eye visual fields normal and left eye visual fields normal.  Neck:     Thyroid: No thyromegaly.     Vascular: No carotid bruit.     Trachea: Trachea normal.  Cardiovascular:      Rate and Rhythm: Normal rate and regular rhythm.     Heart sounds: Normal heart sounds. No murmur heard.    No gallop.  Pulmonary:     Effort: Pulmonary effort is normal. No accessory muscle usage or respiratory distress.     Breath sounds: Normal breath sounds.  Chest:  Breasts:    Right: Normal.     Left: Normal.  Abdominal:     General: Bowel sounds are normal.     Palpations: Abdomen is soft. There is no hepatomegaly or splenomegaly.     Tenderness: There is no abdominal tenderness.  Musculoskeletal:        General: Normal range of motion.     Cervical back: Normal range of motion and neck supple.     Right lower leg: No edema.     Left lower leg: No edema.  Lymphadenopathy:     Head:     Right side of head: No submental, submandibular, tonsillar, preauricular or posterior auricular adenopathy.     Left side of head: No submental, submandibular, tonsillar, preauricular or posterior auricular adenopathy.     Cervical: No cervical adenopathy.     Upper Body:     Right upper body: No supraclavicular, axillary or pectoral adenopathy.     Left upper body: No supraclavicular, axillary or pectoral adenopathy.  Skin:    General: Skin is warm and dry.     Capillary Refill: Capillary refill takes less than 2 seconds.     Findings: No rash.  Neurological:     Mental Status: She is alert and oriented to person, place, and time.     Gait: Gait is intact.  Psychiatric:        Attention and Perception: Attention normal.        Mood and Affect: Mood normal.        Speech: Speech normal.        Behavior: Behavior normal. Behavior is cooperative.        Thought Content: Thought content normal.        Judgment: Judgment normal.        02/14/2022   12:38 PM 02/12/2021    1:53 PM 02/10/2020   11:25 AM  6CIT Screen  What Year? 0 points 0 points 0 points  What month? 0 points 0 points 0 points  What time? 0 points 3 points 0 points  Count back from 20 2 points 0 points 4 points   Months in reverse 4 points 2 points 2 points  Repeat phrase 4 points 8 points 4 points  Total Score 10 points 13 points 10 points     Results for orders placed or performed in visit on 03/01/23  HM DIABETES EYE EXAM  Result Value Ref Range   HM Diabetic Eye Exam No Retinopathy No Retinopathy      Assessment & Plan:   Problem List Items Addressed This Visit       Cardiovascular and Mediastinum   Essential hypertension    Chronic.  Controlled.  Continue with current medication regimen of Amlodipine 10mg .  Recommend checking blood pressures at home.  Refills sent today.  Labs ordered today.  Return to clinic in 6 months for reevaluation.  Call sooner if concerns arise.        Relevant Medications   amLODipine (NORVASC) 5 MG tablet   atorvastatin (LIPITOR) 20 MG tablet   Other Relevant Orders   Microalbumin, Urine Waived   PAD (peripheral artery disease) (HCC)    Chronic. Controlled at this time.  Discussed symptoms to monitor for and when to seek follow up.      Relevant Medications   amLODipine (NORVASC) 5 MG tablet   atorvastatin (LIPITOR) 20 MG tablet     Endocrine   Diabetes mellitus type 2, uncomplicated (HCC)    Chronic.  Controlled.  Last A1c was 6.1%.  Foot exam up to date.  Microalbumin ordered today.  Controlled without medication.  Labs ordered today.  Return to clinic in 3 months for reevaluation.  Call sooner if concerns arise.        Relevant Medications   atorvastatin (LIPITOR) 20 MG tablet   Other Relevant Orders   Comp Met (CMET)   HgB A1c     Other   Obesity    Recommended eating smaller high protein, low fat meals more frequently and exercising 30 mins a day 5 times a week with a goal of 10-15lb weight loss in the next 3 months.        Hypercholesteremia    Chronic.  Controlled.  Continue with current medication regimen on Atorvastatin 20mg  daily.  Refills sent today.  Labs ordered today.  Return to clinic in 3 months for reevaluation.  Call  sooner if concerns arise.        Relevant Medications   amLODipine (NORVASC) 5 MG tablet   atorvastatin (LIPITOR) 20 MG tablet   Other Relevant Orders   Lipid Profile   Mild cognitive impairment    Chronic.  Cancelled appt with Neurology.  Decided she didn't want to go.  Discussed starting anticholinesterase inhibitor today.  Follow up in 3 months.  Call sooner if concerns arise.       Other Visit Diagnoses     Encounter for annual wellness exam in Medicare patient    -  Primary   Advanced care planning/counseling discussion       Annual physical exam       Need for COVID-19 vaccine       Relevant Orders   Pfizer Comirnaty Covid -19 Vaccine 40yrs and older (Completed)        Preventative Services:  AAA screening: NA Health Risk Assessment and Personalized Prevention Plan: Up to date Bone Mass Measurements: up to date Breast Cancer Screening: NA CVD Screening: Up to date Cervical Cancer Screening: NA Colon Cancer Screening: NA Depression Screening:  Up to date Diabetes Screening: Up to date Glaucoma Screening: Up to date Hepatitis B vaccine: NA Hepatitis C screening: Up to date HIV Screening: Up to date Flu Vaccine: Refused Lung cancer Screening: NA Obesity Screening: Up to date Pneumonia Vaccines (2): NA STI Screening: NA  Follow up plan: Return in about 3 months (around 07/05/2023) for HTN, HLD, DM2 FU.   LABORATORY TESTING:  - Pap smear: not applicable  IMMUNIZATIONS:   - Tdap: Tetanus vaccination status reviewed: NA - Influenza: Up to date - Pneumovax: Up to date - Prevnar: Up to date - Zostavax vaccine: Not applicable  SCREENING: -Mammogram: Not applicable  - Colonoscopy:  Not applicable  - Bone Density: Not applicable  -Hearing Test: Not applicable  -Spirometry: Not applicable   PATIENT COUNSELING:   Advised to take 1 mg of folate supplement per day if capable of pregnancy.   Sexuality: Discussed sexually transmitted diseases, partner  selection, use of condoms, avoidance of unintended pregnancy  and contraceptive alternatives.   Advised to avoid cigarette smoking.  I discussed with the patient that most people either abstain from alcohol or drink within safe limits (<=14/week and <=4 drinks/occasion for males, <=7/weeks and <= 3 drinks/occasion for females) and that the risk for alcohol disorders and other health effects rises proportionally with the number of drinks per week and how often a drinker exceeds daily limits.  Discussed cessation/primary prevention of drug use and availability of treatment for abuse.   Diet: Encouraged to adjust caloric intake to maintain  or achieve ideal body weight, to reduce intake of dietary saturated fat and total fat, to limit sodium intake by avoiding high sodium foods and not adding table salt, and to maintain adequate dietary potassium and calcium preferably from fresh fruits, vegetables, and low-fat dairy products.    stressed the importance of regular exercise  Injury prevention: Discussed safety belts, safety helmets, smoke detector, smoking near bedding or upholstery.   Dental health: Discussed importance of regular tooth brushing, flossing, and dental visits.    NEXT PREVENTATIVE PHYSICAL DUE IN 1 YEAR. Return in about 3 months (around 07/05/2023) for HTN, HLD, DM2 FU.

## 2023-04-04 NOTE — Assessment & Plan Note (Signed)
Chronic.  Controlled.  Last A1c was 6.1%.  Foot exam up to date.  Microalbumin ordered today.  Controlled without medication.  Labs ordered today.  Return to clinic in 3 months for reevaluation.  Call sooner if concerns arise.

## 2023-04-05 LAB — COMPREHENSIVE METABOLIC PANEL
ALT: 15 [IU]/L (ref 0–32)
AST: 20 [IU]/L (ref 0–40)
Albumin: 4.3 g/dL (ref 3.8–4.8)
Alkaline Phosphatase: 61 [IU]/L (ref 44–121)
BUN/Creatinine Ratio: 16 (ref 12–28)
BUN: 10 mg/dL (ref 8–27)
Bilirubin Total: 1.4 mg/dL — ABNORMAL HIGH (ref 0.0–1.2)
CO2: 25 mmol/L (ref 20–29)
Calcium: 9 mg/dL (ref 8.7–10.3)
Chloride: 105 mmol/L (ref 96–106)
Creatinine, Ser: 0.62 mg/dL (ref 0.57–1.00)
Globulin, Total: 2.5 g/dL (ref 1.5–4.5)
Glucose: 113 mg/dL — ABNORMAL HIGH (ref 70–99)
Potassium: 3.5 mmol/L (ref 3.5–5.2)
Sodium: 144 mmol/L (ref 134–144)
Total Protein: 6.8 g/dL (ref 6.0–8.5)
eGFR: 90 mL/min/{1.73_m2} (ref >59)

## 2023-04-05 LAB — HEMOGLOBIN A1C
Est. average glucose Bld gHb Est-mCnc: 128 mg/dL
Hgb A1c MFr Bld: 6.1 % — ABNORMAL HIGH (ref 4.8–5.6)

## 2023-04-05 LAB — LIPID PANEL
Chol/HDL Ratio: 2.1 {ratio} (ref 0.0–4.4)
Cholesterol, Total: 137 mg/dL (ref 100–199)
HDL: 66 mg/dL (ref >39)
LDL Chol Calc (NIH): 57 mg/dL (ref 0–99)
Triglycerides: 72 mg/dL (ref 0–149)
VLDL Cholesterol Cal: 14 mg/dL (ref 5–40)

## 2023-04-05 NOTE — Progress Notes (Signed)
Please let Ms. Carol Armstrong know that her lab work looks good.  Her A1c is well controlled at 6.1%.  No concerns at this time.  Follow up as discussed.

## 2023-05-23 DIAGNOSIS — H01005 Unspecified blepharitis left lower eyelid: Secondary | ICD-10-CM | POA: Diagnosis not present

## 2023-05-23 DIAGNOSIS — H0234 Blepharochalasis left upper eyelid: Secondary | ICD-10-CM | POA: Diagnosis not present

## 2023-05-23 DIAGNOSIS — Z01818 Encounter for other preprocedural examination: Secondary | ICD-10-CM | POA: Diagnosis not present

## 2023-05-23 DIAGNOSIS — H01002 Unspecified blepharitis right lower eyelid: Secondary | ICD-10-CM | POA: Diagnosis not present

## 2023-05-23 DIAGNOSIS — H0279 Other degenerative disorders of eyelid and periocular area: Secondary | ICD-10-CM | POA: Diagnosis not present

## 2023-05-23 DIAGNOSIS — H01004 Unspecified blepharitis left upper eyelid: Secondary | ICD-10-CM | POA: Diagnosis not present

## 2023-05-23 DIAGNOSIS — H0231 Blepharochalasis right upper eyelid: Secondary | ICD-10-CM | POA: Diagnosis not present

## 2023-06-05 ENCOUNTER — Other Ambulatory Visit: Payer: Self-pay | Admitting: Nurse Practitioner

## 2023-06-06 NOTE — Telephone Encounter (Signed)
Refilled 06/06/23. Requested Prescriptions  Refused Prescriptions Disp Refills   atorvastatin (LIPITOR) 20 MG tablet [Pharmacy Med Name: ATORVASTATIN 20MG  TABLETS] 90 tablet 1    Sig: TAKE 1 TABLET(20 MG) BY MOUTH DAILY     Cardiovascular:  Antilipid - Statins Failed - 06/06/2023 11:16 AM      Failed - Lipid Panel in normal range within the last 12 months    Cholesterol, Total  Date Value Ref Range Status  04/04/2023 137 100 - 199 mg/dL Final   LDL Chol Calc (NIH)  Date Value Ref Range Status  04/04/2023 57 0 - 99 mg/dL Final   HDL  Date Value Ref Range Status  04/04/2023 66 >39 mg/dL Final   Triglycerides  Date Value Ref Range Status  04/04/2023 72 0 - 149 mg/dL Final         Passed - Patient is not pregnant      Passed - Valid encounter within last 12 months    Recent Outpatient Visits           2 months ago Encounter for annual wellness exam in Medicare patient   [place] [facility] Larae Grooms, NP   7 months ago Essential hypertension   [place] [facility] Larae Grooms, NP   9 months ago Aphasia   [place] [facility] Larae Grooms, NP   1 year ago Annual physical exam   [facility] Larae Grooms, NP   1 year ago Type 2 diabetes mellitus without complication, without long-term current use of insulin (HCC)   [place] [facility] Larae Grooms, NP       Future Appointments             In 4 weeks Larae Grooms, NP [facility]

## 2023-06-06 NOTE — Telephone Encounter (Signed)
Requested by interface surescripts. Future visit in 4 weeks . Requested Prescriptions  Pending Prescriptions Disp Refills   atorvastatin (LIPITOR) 20 MG tablet [Pharmacy Med Name: ATORVASTATIN 20MG  TABLETS] 90 tablet 1    Sig: TAKE 1 TABLET(20 MG) BY MOUTH DAILY     Cardiovascular:  Antilipid - Statins Failed - 06/06/2023 11:08 AM      Failed - Lipid Panel in normal range within the last 12 months    Cholesterol, Total  Date Value Ref Range Status  04/04/2023 137 100 - 199 mg/dL Final   LDL Chol Calc (NIH)  Date Value Ref Range Status  04/04/2023 57 0 - 99 mg/dL Final   HDL  Date Value Ref Range Status  04/04/2023 66 >39 mg/dL Final   Triglycerides  Date Value Ref Range Status  04/04/2023 72 0 - 149 mg/dL Final         Passed - Patient is not pregnant      Passed - Valid encounter within last 12 months    Recent Outpatient Visits           2 months ago Encounter for annual wellness exam in Medicare patient   [place] [facility] Larae Grooms, NP   7 months ago Essential hypertension   [facility] Larae Grooms, NP   9 months ago Aphasia   [place] [facility] Larae Grooms, NP   1 year ago Annual physical exam   [place] [facility] Larae Grooms, NP   1 year ago Type 2 diabetes mellitus without complication, without long-term current use of insulin (HCC)   [place] [facility] Larae Grooms, NP       Future Appointments             In 4 weeks Larae Grooms, NP [facility]

## 2023-06-22 DIAGNOSIS — H2512 Age-related nuclear cataract, left eye: Secondary | ICD-10-CM | POA: Diagnosis not present

## 2023-06-23 DIAGNOSIS — H25012 Cortical age-related cataract, left eye: Secondary | ICD-10-CM | POA: Diagnosis not present

## 2023-06-23 DIAGNOSIS — H2512 Age-related nuclear cataract, left eye: Secondary | ICD-10-CM | POA: Diagnosis not present

## 2023-06-23 DIAGNOSIS — H2511 Age-related nuclear cataract, right eye: Secondary | ICD-10-CM | POA: Diagnosis not present

## 2023-06-23 DIAGNOSIS — H25041 Posterior subcapsular polar age-related cataract, right eye: Secondary | ICD-10-CM | POA: Diagnosis not present

## 2023-06-23 DIAGNOSIS — H25011 Cortical age-related cataract, right eye: Secondary | ICD-10-CM | POA: Diagnosis not present

## 2023-07-05 ENCOUNTER — Ambulatory Visit (INDEPENDENT_AMBULATORY_CARE_PROVIDER_SITE_OTHER): Payer: Medicare Other | Admitting: Nurse Practitioner

## 2023-07-05 ENCOUNTER — Encounter: Payer: Self-pay | Admitting: Nurse Practitioner

## 2023-07-05 VITALS — BP 135/73 | HR 88 | Temp 97.3°F | Ht 66.0 in | Wt 174.2 lb

## 2023-07-05 DIAGNOSIS — G3184 Mild cognitive impairment, so stated: Secondary | ICD-10-CM | POA: Diagnosis not present

## 2023-07-05 DIAGNOSIS — R4701 Aphasia: Secondary | ICD-10-CM | POA: Diagnosis not present

## 2023-07-05 DIAGNOSIS — I739 Peripheral vascular disease, unspecified: Secondary | ICD-10-CM

## 2023-07-05 DIAGNOSIS — E66811 Obesity, class 1: Secondary | ICD-10-CM

## 2023-07-05 DIAGNOSIS — I10 Essential (primary) hypertension: Secondary | ICD-10-CM

## 2023-07-05 DIAGNOSIS — E119 Type 2 diabetes mellitus without complications: Secondary | ICD-10-CM

## 2023-07-05 DIAGNOSIS — E6609 Other obesity due to excess calories: Secondary | ICD-10-CM

## 2023-07-05 DIAGNOSIS — E7800 Pure hypercholesterolemia, unspecified: Secondary | ICD-10-CM

## 2023-07-05 DIAGNOSIS — Z6831 Body mass index (BMI) 31.0-31.9, adult: Secondary | ICD-10-CM

## 2023-07-05 MED ORDER — ATORVASTATIN CALCIUM 20 MG PO TABS: ORAL_TABLET | ORAL | 1 refills | Status: DC

## 2023-07-05 MED ORDER — AMLODIPINE BESYLATE 5 MG PO TABS: ORAL_TABLET | ORAL | 1 refills | Status: DC

## 2023-07-05 NOTE — Assessment & Plan Note (Addendum)
 Chronic. Stable.  Declines referral to Neurology. Declines starting Donzepil.  Discussed benefits of medication during visit.  Referral placed for speech therapy.

## 2023-07-05 NOTE — Assessment & Plan Note (Signed)
 Chronic.  Controlled.  Last A1c was 6.1%.  Foot exam up to date.  Microalbumin ordered today.  Well controlled with diet management.  Controlled without medication.  Labs ordered today.  Return to clinic in 3 months for reevaluation.  Call sooner if concerns arise.

## 2023-07-05 NOTE — Assessment & Plan Note (Signed)
Chronic.  Controlled.  Continue with current medication regimen on Atorvastatin 20mg  daily.  Refills sent today.  Labs ordered today.  Return to clinic in 3 months for reevaluation.  Call sooner if concerns arise.

## 2023-07-05 NOTE — Progress Notes (Signed)
 BP 135/73 (BP Location: Left Arm, Patient Position: Sitting, Cuff Size: Large)   Pulse 88   Temp (!) 97.3 F (36.3 C) (Oral)   Ht 5\' 6"  (1.676 m)   Wt 174 lb 3.2 oz (79 kg)   SpO2 96%   BMI 28.12 kg/m    Subjective:    Patient ID: Carol Armstrong, female    DOB: 1940-07-23, 83 y.o.   MRN: [id number]  HPI: Carol Armstrong is a 83 y.o. female  Chief Complaint  Patient presents with   Hypertension   3 month follow up   HYPERTENSION / HYPERLIPIDEMIA Satisfied with current treatment? yes Duration of hypertension: years BP monitoring frequency: not checking BP range:  BP medication side effects: no Past BP meds: amlodipine  Duration of hyperlipidemia: years Cholesterol medication side effects: no Cholesterol supplements: none Past cholesterol medications: atorvastain (lipitor) Medication compliance: excellent compliance Aspirin: no Recent stressors: no Recurrent headaches: no Visual changes: no Palpitations: no Dyspnea: no Chest pain: no Lower extremity edema: no Dizzy/lightheaded: no  DIABETES Hypoglycemic episodes:no Polydipsia/polyuria: no Visual disturbance: no Chest pain: no Paresthesias: no Glucose Monitoring: no  Accucheck frequency: Not Checking  Fasting glucose:  Post prandial:  Evening:  Before meals: Taking Insulin?: no  Long acting insulin:  Short acting insulin: Blood Pressure Monitoring: not checking Retinal Examination: Up to Date Foot Exam: Up to Date Diabetic Education: Not Completed Pneumovax: Not up to Date Influenza: Not up to Date Aspirin: no  MEMORY Patient feels like her symptoms are about the son.  Her son is at her appointment with her today  and he agrees.  They are wondering if speech therapy would be beneficial for the patient.     Relevant past medical, surgical, family and social history reviewed and updated as indicated. Interim medical history since our last visit reviewed. Allergies and medications reviewed and  updated.  Review of Systems  Eyes:  Negative for visual disturbance.  Respiratory:  Negative for cough, chest tightness and shortness of breath.   Cardiovascular:  Negative for chest pain, palpitations and leg swelling.  Endocrine: Negative for polydipsia and polyuria.  Neurological:  Positive for speech difficulty. Negative for dizziness, numbness and headaches.       Memory deficit    Per HPI unless specifically indicated above     Objective:    BP 135/73 (BP Location: Left Arm, Patient Position: Sitting, Cuff Size: Large)   Pulse 88   Temp (!) 97.3 F (36.3 C) (Oral)   Ht 5\' 6"  (1.676 m)   Wt 174 lb 3.2 oz (79 kg)   SpO2 96%   BMI 28.12 kg/m   Wt Readings from Last 3 Encounters:  07/05/23 174 lb 3.2 oz (79 kg)  04/04/23 179 lb (81.2 kg)  11/01/22 183 lb 12.8 oz (83.4 kg)    Physical Exam Vitals and nursing note reviewed.  Constitutional:      General: She is not in acute distress.    Appearance: Normal appearance. She is normal weight. She is not ill-appearing, toxic-appearing or diaphoretic.  HENT:     Head: Normocephalic.     Right Ear: External ear normal.     Left Ear: External ear normal.     Nose: Nose normal.     Mouth/Throat:     Mouth: Mucous membranes are moist.     Pharynx: Oropharynx is clear.  Eyes:     General:        Right eye: No discharge.  Left eye: No discharge.     Extraocular Movements: Extraocular movements intact.     Conjunctiva/sclera: Conjunctivae normal.     Pupils: Pupils are equal, round, and reactive to light.  Cardiovascular:     Rate and Rhythm: Normal rate and regular rhythm.     Heart sounds: No murmur heard. Pulmonary:     Effort: Pulmonary effort is normal. No respiratory distress.     Breath sounds: Normal breath sounds. No wheezing or rales.  Musculoskeletal:     Cervical back: Normal range of motion and neck supple.  Skin:    General: Skin is warm and dry.     Capillary Refill: Capillary refill takes less than  2 seconds.  Neurological:     General: No focal deficit present.     Mental Status: She is alert and oriented to person, place, and time. Mental status is at baseline.  Psychiatric:        Mood and Affect: Mood normal.        Behavior: Behavior normal.        Thought Content: Thought content normal.        Judgment: Judgment normal.     Results for orders placed or performed in visit on 04/04/23  Comp Met (CMET)   Collection Time: 04/04/23  2:11 PM  Result Value Ref Range   Glucose 113 (H) 70 - 99 mg/dL   BUN 10 8 - 27 mg/dL   Creatinine, Ser 1.61 0.57 - 1.00 mg/dL   eGFR 90 >09 UE/AVW/0.98   BUN/Creatinine Ratio 16 12 - 28   Sodium 144 134 - 144 mmol/L   Potassium 3.5 3.5 - 5.2 mmol/L   Chloride 105 96 - 106 mmol/L   CO2 25 20 - 29 mmol/L   Calcium  9.0 8.7 - 10.3 mg/dL   Total Protein 6.8 6.0 - 8.5 g/dL   Albumin 4.3 3.8 - 4.8 g/dL   Globulin, Total 2.5 1.5 - 4.5 g/dL   Bilirubin Total 1.4 (H) 0.0 - 1.2 mg/dL   Alkaline Phosphatase 61 44 - 121 IU/L   AST 20 0 - 40 IU/L   ALT 15 0 - 32 IU/L  Lipid Profile   Collection Time: 04/04/23  2:11 PM  Result Value Ref Range   Cholesterol, Total 137 100 - 199 mg/dL   Triglycerides 72 0 - 149 mg/dL   HDL 66 >11 mg/dL   VLDL Cholesterol Cal 14 5 - 40 mg/dL   LDL Chol Calc (NIH) 57 0 - 99 mg/dL   Chol/HDL Ratio 2.1 0.0 - 4.4 ratio  HgB A1c   Collection Time: 04/04/23  2:11 PM  Result Value Ref Range   Hgb A1c MFr Bld 6.1 (H) 4.8 - 5.6 %   Est. average glucose Bld gHb Est-mCnc 128 mg/dL  Microalbumin, Urine Waived   Collection Time: 04/04/23  2:43 PM  Result Value Ref Range   Microalb, Ur Waived 80 (H) 0 - 19 mg/L   Creatinine, Urine Waived 300 10 - 300 mg/dL   Microalb/Creat Ratio 30-300 (H) <30 mg/g      Assessment & Plan:   Problem List Items Addressed This Visit       Cardiovascular and Mediastinum   Essential hypertension   Chronic.  Controlled.  Continue with current medication regimen of Amlodipine .  Refills  sent today.  Labs ordered today.  Return to clinic in 6 months for reevaluation.  Call sooner if concerns arise.        Relevant Medications  amLODipine  (NORVASC ) 5 MG tablet   atorvastatin  (LIPITOR) 20 MG tablet   PAD (peripheral artery disease) (HCC)   Chronic. Controlled at this time.  Discussed symptoms to monitor for and when to seek follow up.      Relevant Medications   amLODipine  (NORVASC ) 5 MG tablet   atorvastatin  (LIPITOR) 20 MG tablet     Endocrine   Diabetes mellitus type 2, uncomplicated (HCC) - Primary   Chronic.  Controlled.  Last A1c was 6.1%.  Foot exam up to date.  Microalbumin ordered today.  Well controlled with diet management.  Controlled without medication.  Labs ordered today.  Return to clinic in 3 months for reevaluation.  Call sooner if concerns arise.       Relevant Medications   atorvastatin  (LIPITOR) 20 MG tablet   Other Relevant Orders   Comp Met (CMET)   HgB A1c     Other   Hypercholesteremia   Chronic.  Controlled.  Continue with current medication regimen on Atorvastatin  20mg  daily.  Refills sent today.  Labs ordered today.  Return to clinic in 3 months for reevaluation.  Call sooner if concerns arise.       Relevant Medications   amLODipine  (NORVASC ) 5 MG tablet   atorvastatin  (LIPITOR) 20 MG tablet   Other Relevant Orders   Lipid Profile   Mild cognitive impairment   Chronic. Stable.  Declines referral to Neurology. Declines starting Donzepil.  Discussed benefits of medication during visit.  Referral placed for speech therapy.      Relevant Orders   Ambulatory referral to Speech Therapy   Aphasia   Relevant Orders   Ambulatory referral to Speech Therapy   RESOLVED: Obesity     Follow up plan: Return in about 3 months (around 10/03/2023) for HTN, HLD, DM2 FU.

## 2023-07-05 NOTE — Assessment & Plan Note (Signed)
Chronic.  Controlled.  Continue with current medication regimen of Amlodipine.  Refills sent today.  Labs ordered today.  Return to clinic in 6 months for reevaluation.  Call sooner if concerns arise.    

## 2023-07-05 NOTE — Assessment & Plan Note (Signed)
Chronic. Controlled at this time.  Discussed symptoms to monitor for and when to seek follow up.

## 2023-07-06 LAB — COMPREHENSIVE METABOLIC PANEL
ALT: 24 [IU]/L (ref 0–32)
AST: 24 [IU]/L (ref 0–40)
Albumin: 4.1 g/dL (ref 3.8–4.8)
Alkaline Phosphatase: 65 [IU]/L (ref 44–121)
BUN/Creatinine Ratio: 15 (ref 12–28)
BUN: 11 mg/dL (ref 8–27)
Bilirubin Total: 1.1 mg/dL (ref 0.0–1.2)
CO2: 25 mmol/L (ref 20–29)
Calcium: 9.1 mg/dL (ref 8.7–10.3)
Chloride: 107 mmol/L — ABNORMAL HIGH (ref 96–106)
Creatinine, Ser: 0.74 mg/dL (ref 0.57–1.00)
Globulin, Total: 2.5 g/dL (ref 1.5–4.5)
Glucose: 116 mg/dL — ABNORMAL HIGH (ref 70–99)
Potassium: 3.9 mmol/L (ref 3.5–5.2)
Sodium: 146 mmol/L — ABNORMAL HIGH (ref 134–144)
Total Protein: 6.6 g/dL (ref 6.0–8.5)
eGFR: 82 mL/min/{1.73_m2} (ref >59)

## 2023-07-06 LAB — LIPID PANEL
Chol/HDL Ratio: 2.1 {ratio} (ref 0.0–4.4)
Cholesterol, Total: 144 mg/dL (ref 100–199)
HDL: 70 mg/dL (ref >39)
LDL Chol Calc (NIH): 54 mg/dL (ref 0–99)
Triglycerides: 117 mg/dL (ref 0–149)
VLDL Cholesterol Cal: 20 mg/dL (ref 5–40)

## 2023-07-06 LAB — HEMOGLOBIN A1C
Est. average glucose Bld gHb Est-mCnc: 131 mg/dL
Hgb A1c MFr Bld: 6.2 % — ABNORMAL HIGH (ref 4.8–5.6)

## 2023-08-01 ENCOUNTER — Ambulatory Visit: Payer: Medicare Other | Attending: Nurse Practitioner | Admitting: Speech Pathology

## 2023-08-01 DIAGNOSIS — G3184 Mild cognitive impairment, so stated: Secondary | ICD-10-CM | POA: Diagnosis not present

## 2023-08-01 DIAGNOSIS — R4701 Aphasia: Secondary | ICD-10-CM | POA: Diagnosis not present

## 2023-08-01 DIAGNOSIS — R41841 Cognitive communication deficit: Secondary | ICD-10-CM | POA: Insufficient documentation

## 2023-08-01 DIAGNOSIS — R6889 Other general symptoms and signs: Secondary | ICD-10-CM | POA: Insufficient documentation

## 2023-08-01 NOTE — Therapy (Unsigned)
OUTPATIENT SPEECH LANGUAGE PATHOLOGY  COGNITION EVALUATION   Patient Name: Carol Armstrong MRN: [id number] DOB:1940/11/26, 83 y.o., female Today's Date: 08/01/2023  PCP: Larae Grooms, NP REFERRING PROVIDER: Larae Grooms, NP   End of Session - 08/01/23 1316     Visit Number 1    Number of Visits 17    Date for SLP Re-Evaluation 09/26/23    Authorization Type [employer]    Progress Note Due on Visit 10    SLP Start Time 1315    SLP Stop Time  1410    SLP Time Calculation (min) 55 min    Activity Tolerance Patient tolerated treatment well             Past Medical History:  Diagnosis Date   Hypertension    Prediabetes    Wears dentures    partial upper and lower   Past Surgical History:  Procedure Laterality Date   COLONOSCOPY     COLONOSCOPY WITH PROPOFOL N/A 10/07/2019   Procedure: COLONOSCOPY WITH PROPOFOL;  Surgeon: Midge Minium, MD;  [facility]: [facility];  Service: Endoscopy;  Laterality: N/A;  Latex  priority 4   POLYPECTOMY  10/07/2019   Procedure: POLYPECTOMY;  Surgeon: Midge Minium, MD;  Location: [facility];  Service: Endoscopy;;   TUBAL LIGATION     Patient Active Problem List   Diagnosis Date Noted   Aphasia 09/02/2022   PAD (peripheral artery disease) (HCC) 12/27/2021   Mild cognitive impairment 05/28/2021   Diabetes mellitus type 2, uncomplicated (HCC) 12/17/2020   Forgetfulness 09/17/2020   Hypercholesteremia 03/05/2020   History of colonic polyps    Polyp of transverse colon    Essential hypertension 08/01/2019    ONSET DATE: 09/01/2022 onset of aphasia;  07/05/2023 date of referral  REFERRING DIAG:  G31.84 (ICD-10-CM) - Mild cognitive impairment  R47.01 (ICD-10-CM) - Aphasia    THERAPY DIAG:  Cognitive communication deficit  Forgetfulness  Aphasia  Mild cognitive impairment  Rationale for Evaluation and Treatment Rehabilitation  SUBJECTIVE:   SUBJECTIVE STATEMENT: Pt pleasant, reduced  speech intelligibility, accompanied by her daughter, son arrived late into the session Pt accompanied by: family member  PERTINENT HISTORY:  Pt is an 83 year old female with review of chart revealing note dated 09/01/2022 reveals "patient is hesitant to talk or answer questions due to difficulty speaking. Concern that pt has had previous stroke. Will order MRI. MMSE 21"    MRI 11/23/2022 1. No acute intracranial process. 2. Mildly advanced cerebral volume loss for age, which appears somewhat more prominent in the bilateral parietal lobes.  While pt doesn't have medical diagnosis of dementia in her chart, message from NP on 11/23/2022 states "Please let patient know that her MRI does show that she has dementia. It has worsened since the last imaging she had. However, there is no evidence of a present or past stroke. Symptoms are likely related to dementia."  Per chart, pt has declined referral to neurology as well as Donzepil.   PAIN:  Are you having pain? No   FALLS: Has patient fallen in last 6 months?  No  LIVING ENVIRONMENT: Lives with: lives with their family Lives in: House/apartment  PLOF:  Level of assistance: Needed assistance with ADLs, Needed assistance with IADLS Employment: Retired   PATIENT GOALS   to assess cognitive function and establish any additional   OBJECTIVE:   COGNITIVE COMMUNICATION Overall cognitive status: History of cognitive impairments - at baseline Areas of impairment:  Oriented to person  Attention: Impaired: Selective Memory: Impaired: Immediate Working Short term Producer, television/film/video function: Impaired: Comment: all impaired by lower level deficits Functional Impairments: pt has support   AUDITORY COMPREHENSION  Overall auditory comprehension: Impaired: simple YES/NO questions: Impaired: moderately complex Following directions: Impaired: moderately complex Conversation: Simple Effective technique: repetition/stressing  words  READING COMPREHENSION: Intact: simple  EXPRESSION: verbal  VERBAL EXPRESSION:   Overall verbal expression: Impaired: simple Level of generative/spontaneous verbalization: conversation Automatic speech: name: intact and social response: intact  Repetition: Appears intact Naming: Confrontation: 76-100% Pragmatics: Appears intact Non-verbal means of communication: N/A  WRITTEN EXPRESSION: Dominant hand: right Written expression: Not tested  ORAL MOTOR EXAMINATION Facial : WFL Lingual: WFL Velum: WFL Mandible: WFL Cough: WFL Voice: WFL  MOTOR SPEECH: Overall motor speech: impaired Level of impairment: Word Respiration: diaphragmatic/abdominal breathing Phonation: low vocal intensity Resonance: WFL Articulation: Impaired: word Intelligibility: Intelligibility reduced Motor planning: Impaired: aware and groping for words Motor speech errors: aware and groping for words   STANDARDIZED ASSESSMENTS: Attempted ACE III but pt with increased frustrated d/t inability to recall information and complete tasks   TODAY'S TREATMENT:  Skilled verbal and written information provided on compensatory memory strategies to help pt with routines, external and internal aids   PATIENT EDUCATION: Education details: cognitive impairment, safety, ST POC, dementia Person educated: Patient and Child(ren) Education method: Explanation and Handouts Education comprehension: verbalized understanding    GOALS:  Goals reviewed with patient? Yes  SHORT TERM GOALS: Target date: 10 sessions  With cues, pt will use compensatory memory strategies to complete basic functional ADLs and iADLS safety per children report.  Baseline: Goal status: INITIAL   LONG TERM GOALS: Target date: 09/26/2023  With Mod I, patient/family will demonstrate understanding of the following concepts: dementia, cognitive and communication, strengths/strategies to promote success, local resources by answering  multiple choice questions with 80% accuracy when provided supported conversation in order to increase patient's participation in medical care.  Baseline:  Goal status: INITIAL   ASSESSMENT:  CLINICAL IMPRESSION: Patient is a 83 y.o. female who was seen today for a cognitive communication evaluation d/t reported difficulty with word findings, cognition and speech intelligibility d/t dementia. Pt presents with significant cognitive impairment in all areas of cognition including language comprehension, expression and speech intelligibility. Pt's speech is c/b dysfluent with repetitive mouth movements decreasing her ability within this structured setting of communicating. However when engaging in conversation about her family her speech intelligibility increased to > 90% at the sentence level. Pt frequently requested repetition of auditory information.  Pt's daughter was present throughout and provides pt history as well as current living situation. Currently pt has family living with her who provide functional support and ensure safety when completing tasks.   OBJECTIVE IMPAIRMENTS include attention, memory, awareness, and expressive language. These impairments are limiting patient from managing medications, managing appointments, managing finances, household responsibilities, ADLs/IADLs, and effectively communicating at home and in community. Factors affecting potential to achieve goals and functional outcome are ability to learn/carryover information, co-morbidities, medical prognosis, severity of impairments, and family/community support. Patient will benefit from skilled SLP services to address above impairments and improve overall function.  REHAB POTENTIAL: Good  PLAN: SLP FREQUENCY: 1-2x/week  SLP DURATION: 8 weeks  PLANNED INTERVENTIONS: Environmental controls, Internal/external aids, SLP instruction and feedback, Compensatory strategies, and Patient/family education   Troy Kanouse B. Dreama Saa,  M.S., CCC-SLP, Tree surgeon Certified Brain Injury Specialist [facility]  [facility] Office [phone] Ascom [phone] Fax [phone]

## 2023-08-03 DIAGNOSIS — H2511 Age-related nuclear cataract, right eye: Secondary | ICD-10-CM | POA: Diagnosis not present

## 2023-08-03 DIAGNOSIS — H25011 Cortical age-related cataract, right eye: Secondary | ICD-10-CM | POA: Diagnosis not present

## 2023-08-08 ENCOUNTER — Ambulatory Visit: Payer: Medicare Other | Admitting: Speech Pathology

## 2023-08-10 ENCOUNTER — Ambulatory Visit: Payer: Medicare Other | Admitting: Speech Pathology

## 2023-08-15 ENCOUNTER — Encounter: Payer: Medicare Other | Admitting: Speech Pathology

## 2023-08-17 ENCOUNTER — Encounter: Payer: Medicare Other | Admitting: Speech Pathology

## 2023-08-22 ENCOUNTER — Encounter: Payer: Medicare Other | Admitting: Speech Pathology

## 2023-08-25 ENCOUNTER — Encounter: Payer: Medicare Other | Admitting: Speech Pathology

## 2023-08-29 ENCOUNTER — Encounter: Payer: Medicare Other | Admitting: Speech Pathology

## 2023-09-01 ENCOUNTER — Encounter: Payer: Medicare Other | Admitting: Speech Pathology

## 2023-09-05 ENCOUNTER — Encounter: Payer: Medicare Other | Admitting: Speech Pathology

## 2023-09-08 ENCOUNTER — Encounter: Payer: Medicare Other | Admitting: Speech Pathology

## 2023-09-12 ENCOUNTER — Encounter: Payer: Medicare Other | Admitting: Speech Pathology

## 2023-09-15 ENCOUNTER — Encounter: Payer: Medicare Other | Admitting: Speech Pathology

## 2023-09-19 ENCOUNTER — Encounter: Payer: Medicare Other | Admitting: Speech Pathology

## 2023-09-21 ENCOUNTER — Encounter: Payer: Medicare Other | Admitting: Speech Pathology

## 2023-09-26 ENCOUNTER — Encounter: Payer: Medicare Other | Admitting: Speech Pathology

## 2023-09-28 ENCOUNTER — Encounter: Payer: Medicare Other | Admitting: Speech Pathology

## 2023-10-03 ENCOUNTER — Encounter: Payer: Medicare Other | Admitting: Speech Pathology

## 2023-10-05 ENCOUNTER — Encounter: Payer: Medicare Other | Admitting: Speech Pathology

## 2023-10-10 ENCOUNTER — Encounter: Payer: Medicare Other | Admitting: Speech Pathology

## 2023-10-10 ENCOUNTER — Encounter: Payer: Self-pay | Admitting: Nurse Practitioner

## 2023-10-10 ENCOUNTER — Ambulatory Visit: Payer: Self-pay | Admitting: Nurse Practitioner

## 2023-10-10 VITALS — BP 128/76 | HR 80 | Temp 98.3°F | Resp 17 | Ht 65.98 in | Wt 173.6 lb

## 2023-10-10 DIAGNOSIS — G3184 Mild cognitive impairment, so stated: Secondary | ICD-10-CM | POA: Diagnosis not present

## 2023-10-10 DIAGNOSIS — I10 Essential (primary) hypertension: Secondary | ICD-10-CM

## 2023-10-10 DIAGNOSIS — E7800 Pure hypercholesterolemia, unspecified: Secondary | ICD-10-CM

## 2023-10-10 DIAGNOSIS — E119 Type 2 diabetes mellitus without complications: Secondary | ICD-10-CM

## 2023-10-10 DIAGNOSIS — I739 Peripheral vascular disease, unspecified: Secondary | ICD-10-CM | POA: Diagnosis not present

## 2023-10-10 MED ORDER — DONEPEZIL HCL 10 MG PO TABS: 10.0000 mg | ORAL_TABLET | Freq: Every day | ORAL | 1 refills | Status: DC

## 2023-10-10 MED ORDER — AMLODIPINE BESYLATE 5 MG PO TABS: ORAL_TABLET | ORAL | 1 refills | Status: DC

## 2023-10-10 MED ORDER — ATORVASTATIN CALCIUM 20 MG PO TABS: ORAL_TABLET | ORAL | 1 refills | Status: DC

## 2023-10-10 NOTE — Progress Notes (Signed)
 BP 128/76 (BP Location: Right Arm, Patient Position: Sitting, Cuff Size: Normal)   Pulse 80   Temp 98.3 F (36.8 C) (Oral)   Resp 17   Ht 5' 5.98" (1.676 m)   Wt 173 lb 9.6 oz (78.7 kg)   SpO2 98%   BMI 28.03 kg/m    Subjective:    Patient ID: Carol Armstrong, female    DOB: 1940/10/24, 83 y.o.   MRN: [id number]  HPI: Carol Armstrong is a 83 y.o. female  Chief Complaint  Patient presents with   Follow-up   Hypertension    No home checks.    hullicinations    Son says it started about 2 weeks ago. Mom said someone was coming into the house. Son thinks it may be scary for her.    Diabetes    No home checks.    HYPERTENSION / HYPERLIPIDEMIA Satisfied with current treatment? yes Duration of hypertension: years BP monitoring frequency: not checking BP range:  BP medication side effects: no Past BP meds: amlodipine  Duration of hyperlipidemia: years Cholesterol medication side effects: no Cholesterol supplements: none Past cholesterol medications: atorvastain (lipitor) Medication compliance: excellent compliance Aspirin: no Recent stressors: no Recurrent headaches: no Visual changes: no Palpitations: no Dyspnea: no Chest pain: no Lower extremity edema: no Dizzy/lightheaded: no  DIABETES Hypoglycemic episodes:no Polydipsia/polyuria: no Visual disturbance: no Chest pain: no Paresthesias: no Glucose Monitoring: no  Accucheck frequency: Not Checking  Fasting glucose:  Post prandial:  Evening:  Before meals: Taking Insulin?: no  Long acting insulin:  Short acting insulin: Blood Pressure Monitoring: not checking Retinal Examination: Up to Date Foot Exam: Up to Date Diabetic Education: Not Completed Pneumovax: Not up to Date Influenza: Not up to Date Aspirin: no  MEMORY Patient feels like her symptoms have worsened some.  Her son is at her appointment with her today and he agrees that her memory has declined some.     Relevant past medical, surgical,  family and social history reviewed and updated as indicated. Interim medical history since our last visit reviewed. Allergies and medications reviewed and updated.  Review of Systems  Eyes:  Negative for visual disturbance.  Respiratory:  Negative for cough, chest tightness and shortness of breath.   Cardiovascular:  Negative for chest pain, palpitations and leg swelling.  Endocrine: Negative for polydipsia and polyuria.  Neurological:  Positive for speech difficulty. Negative for dizziness, numbness and headaches.       Memory deficit    Per HPI unless specifically indicated above     Objective:    BP 128/76 (BP Location: Right Arm, Patient Position: Sitting, Cuff Size: Normal)   Pulse 80   Temp 98.3 F (36.8 C) (Oral)   Resp 17   Ht 5' 5.98" (1.676 m)   Wt 173 lb 9.6 oz (78.7 kg)   SpO2 98%   BMI 28.03 kg/m   Wt Readings from Last 3 Encounters:  10/10/23 173 lb 9.6 oz (78.7 kg)  07/05/23 174 lb 3.2 oz (79 kg)  04/04/23 179 lb (81.2 kg)    Physical Exam Vitals and nursing note reviewed.  Constitutional:      General: She is not in acute distress.    Appearance: Normal appearance. She is normal weight. She is not ill-appearing, toxic-appearing or diaphoretic.  HENT:     Head: Normocephalic.     Right Ear: External ear normal.     Left Ear: External ear normal.     Nose: Nose normal.  Mouth/Throat:     Mouth: Mucous membranes are moist.     Pharynx: Oropharynx is clear.  Eyes:     General:        Right eye: No discharge.        Left eye: No discharge.     Extraocular Movements: Extraocular movements intact.     Conjunctiva/sclera: Conjunctivae normal.     Pupils: Pupils are equal, round, and reactive to light.  Cardiovascular:     Rate and Rhythm: Normal rate and regular rhythm.     Heart sounds: No murmur heard. Pulmonary:     Effort: Pulmonary effort is normal. No respiratory distress.     Breath sounds: Normal breath sounds. No wheezing or rales.   Musculoskeletal:     Cervical back: Normal range of motion and neck supple.  Skin:    General: Skin is warm and dry.     Capillary Refill: Capillary refill takes less than 2 seconds.  Neurological:     General: No focal deficit present.     Mental Status: She is alert and oriented to person, place, and time. Mental status is at baseline.     Comments: Hesitancy with speech.  Studer at times  Psychiatric:        Mood and Affect: Mood normal.        Behavior: Behavior normal.        Thought Content: Thought content normal.        Judgment: Judgment normal.     Results for orders placed or performed in visit on 07/05/23  Comp Met (CMET)   Collection Time: 07/05/23  1:35 PM  Result Value Ref Range   Glucose 116 (H) 70 - 99 mg/dL   BUN 11 8 - 27 mg/dL   Creatinine, Ser 1.61 0.57 - 1.00 mg/dL   eGFR 82 >09 UE/AVW/0.98   BUN/Creatinine Ratio 15 12 - 28   Sodium 146 (H) 134 - 144 mmol/L   Potassium 3.9 3.5 - 5.2 mmol/L   Chloride 107 (H) 96 - 106 mmol/L   CO2 25 20 - 29 mmol/L   Calcium  9.1 8.7 - 10.3 mg/dL   Total Protein 6.6 6.0 - 8.5 g/dL   Albumin 4.1 3.8 - 4.8 g/dL   Globulin, Total 2.5 1.5 - 4.5 g/dL   Bilirubin Total 1.1 0.0 - 1.2 mg/dL   Alkaline Phosphatase 65 44 - 121 IU/L   AST 24 0 - 40 IU/L   ALT 24 0 - 32 IU/L  Lipid Profile   Collection Time: 07/05/23  1:35 PM  Result Value Ref Range   Cholesterol, Total 144 100 - 199 mg/dL   Triglycerides 119 0 - 149 mg/dL   HDL 70 >14 mg/dL   VLDL Cholesterol Cal 20 5 - 40 mg/dL   LDL Chol Calc (NIH) 54 0 - 99 mg/dL   Chol/HDL Ratio 2.1 0.0 - 4.4 ratio  HgB A1c   Collection Time: 07/05/23  1:35 PM  Result Value Ref Range   Hgb A1c MFr Bld 6.2 (H) 4.8 - 5.6 %   Est. average glucose Bld gHb Est-mCnc 131 mg/dL      Assessment & Plan:   Problem List Items Addressed This Visit       Cardiovascular and Mediastinum   Essential hypertension - Primary   Chronic.  Controlled.  Continue with current medication regimen of  Amlodipine .  Refills sent today.  Labs ordered today.  Return to clinic in 6 months for reevaluation.  Call sooner  if concerns arise.       Relevant Medications   amLODipine  (NORVASC ) 5 MG tablet   atorvastatin  (LIPITOR) 20 MG tablet   Other Relevant Orders   Comprehensive metabolic panel with GFR   PAD (peripheral artery disease) (HCC)   Relevant Medications   amLODipine  (NORVASC ) 5 MG tablet   atorvastatin  (LIPITOR) 20 MG tablet     Endocrine   Diabetes mellitus type 2, uncomplicated (HCC)   Chronic.  Controlled.  Last A1c was 6.1%.  Foot exam up to date.  Microalbumin up to date.  Well controlled with diet management.  Controlled without medication.  Labs ordered today.  Return to clinic in 3 months for reevaluation.  Call sooner if concerns arise.       Relevant Medications   atorvastatin  (LIPITOR) 20 MG tablet   Other Relevant Orders   Hemoglobin A1c     Other   Hypercholesteremia   Chronic.  Controlled.  Continue with current medication regimen on Atorvastatin  20mg  daily.  Refills sent today.  Labs ordered today.  Return to clinic in 3 months for reevaluation.  Call sooner if concerns arise.       Relevant Medications   amLODipine  (NORVASC ) 5 MG tablet   atorvastatin  (LIPITOR) 20 MG tablet   Other Relevant Orders   Lipid panel   Mild cognitive impairment   Will start Aricept .  Side effects and benefits of medication discussed during visit today.  Family has noticed some decline in her memory.  Speech therapy was not able to assist.  Follow up in 3 months.  Call sooner if concerns arise.          Follow up plan: Return in about 3 months (around 01/09/2024) for HTN, HLD, DM2 FU.

## 2023-10-10 NOTE — Assessment & Plan Note (Signed)
Chronic.  Controlled.  Continue with current medication regimen of Amlodipine.  Refills sent today.  Labs ordered today.  Return to clinic in 6 months for reevaluation.  Call sooner if concerns arise.    

## 2023-10-10 NOTE — Assessment & Plan Note (Signed)
 Chronic.  Controlled.  Last A1c was 6.1%.  Foot exam up to date.  Microalbumin up to date.  Well controlled with diet management.  Controlled without medication.  Labs ordered today.  Return to clinic in 3 months for reevaluation.  Call sooner if concerns arise.

## 2023-10-10 NOTE — Assessment & Plan Note (Signed)
 Will start Aricept .  Side effects and benefits of medication discussed during visit today.  Family has noticed some decline in her memory.  Speech therapy was not able to assist.  Follow up in 3 months.  Call sooner if concerns arise.

## 2023-10-10 NOTE — Assessment & Plan Note (Signed)
Chronic.  Controlled.  Continue with current medication regimen on Atorvastatin 20mg  daily.  Refills sent today.  Labs ordered today.  Return to clinic in 3 months for reevaluation.  Call sooner if concerns arise.

## 2023-10-11 ENCOUNTER — Encounter: Payer: Self-pay | Admitting: Nurse Practitioner

## 2023-10-11 LAB — LIPID PANEL
Chol/HDL Ratio: 2.1 ratio (ref 0.0–4.4)
Cholesterol, Total: 149 mg/dL (ref 100–199)
HDL: 71 mg/dL (ref >39)
LDL Chol Calc (NIH): 63 mg/dL (ref 0–99)
Triglycerides: 76 mg/dL (ref 0–149)
VLDL Cholesterol Cal: 15 mg/dL (ref 5–40)

## 2023-10-11 LAB — COMPREHENSIVE METABOLIC PANEL WITH GFR
ALT: 19 IU/L (ref 0–32)
AST: 27 IU/L (ref 0–40)
Albumin: 4.3 g/dL (ref 3.8–4.8)
Alkaline Phosphatase: 63 IU/L (ref 44–121)
BUN/Creatinine Ratio: 19 (ref 12–28)
BUN: 15 mg/dL (ref 8–27)
Bilirubin Total: 1 mg/dL (ref 0.0–1.2)
CO2: 26 mmol/L (ref 20–29)
Calcium: 9.7 mg/dL (ref 8.7–10.3)
Chloride: 107 mmol/L — ABNORMAL HIGH (ref 96–106)
Creatinine, Ser: 0.77 mg/dL (ref 0.57–1.00)
Globulin, Total: 2.8 g/dL (ref 1.5–4.5)
Glucose: 89 mg/dL (ref 70–99)
Potassium: 4 mmol/L (ref 3.5–5.2)
Sodium: 144 mmol/L (ref 134–144)
Total Protein: 7.1 g/dL (ref 6.0–8.5)
eGFR: 78 mL/min/{1.73_m2} (ref >59)

## 2023-10-11 LAB — HEMOGLOBIN A1C
Est. average glucose Bld gHb Est-mCnc: 123 mg/dL
Hgb A1c MFr Bld: 5.9 % — ABNORMAL HIGH (ref 4.8–5.6)

## 2023-10-12 ENCOUNTER — Encounter: Payer: Medicare Other | Admitting: Speech Pathology

## 2023-10-17 ENCOUNTER — Encounter: Payer: Medicare Other | Admitting: Speech Pathology

## 2023-10-19 ENCOUNTER — Encounter: Payer: Medicare Other | Admitting: Speech Pathology

## 2023-10-24 ENCOUNTER — Encounter: Payer: Medicare Other | Admitting: Speech Pathology

## 2023-10-26 ENCOUNTER — Encounter: Payer: Medicare Other | Admitting: Speech Pathology

## 2023-10-31 ENCOUNTER — Encounter: Payer: Medicare Other | Admitting: Speech Pathology

## 2023-11-02 ENCOUNTER — Encounter: Payer: Medicare Other | Admitting: Speech Pathology

## 2023-11-07 ENCOUNTER — Encounter: Payer: Medicare Other | Admitting: Speech Pathology

## 2023-11-09 ENCOUNTER — Encounter: Payer: Medicare Other | Admitting: Speech Pathology

## 2023-12-07 ENCOUNTER — Other Ambulatory Visit: Payer: Self-pay | Admitting: Nurse Practitioner

## 2023-12-07 NOTE — Telephone Encounter (Unsigned)
 Copied from CRM [id number]. Topic: Clinical - Medication Refill >> Dec 07, 2023  2:44 PM Phil Braun wrote: Medication:  amLODipine  (NORVASC ) 5 MG tablet Has the patient contacted their pharmacy? Yes   This is the patient's preferred pharmacy:  [id number] - [place], [place] - [address] AT [facility] [address] [place] [address] [place] [address] Phone: [phone] Fax: [phone]    Is this the correct pharmacy for this prescription? Yes If no, delete pharmacy and type the correct one.   Has the prescription been filled recently? Yes  Is the patient out of the medication? Yes  Has the patient been seen for an appointment in the last year OR does the patient have an upcoming appointment? Yes  Can we respond through MyChart? No  Agent: Please be advised that Rx refills may take up to 3 business days. We ask that you follow-up with your pharmacy.

## 2023-12-11 NOTE — Telephone Encounter (Signed)
 Too soon for refill, refilled 10/10/23.  Requested Prescriptions  Pending Prescriptions Disp Refills   amLODipine  (NORVASC ) 5 MG tablet 90 tablet 1    Sig: Take 1 tablet by mouth daily     Cardiovascular: Calcium  Channel Blockers 2 Passed - 12/11/2023 12:35 PM      Passed - Last BP in normal range    BP Readings from Last 1 Encounters:  10/10/23 128/76         Passed - Last Heart Rate in normal range    Pulse Readings from Last 1 Encounters:  10/10/23 80         Passed - Valid encounter within last 6 months    Recent Outpatient Visits           2 months ago Essential hypertension   [facility] Melvin Pao, NP

## 2023-12-12 NOTE — Telephone Encounter (Unsigned)
 Copied from CRM [id number]. Topic: Clinical - Prescription Issue >> Dec 12, 2023  4:38 PM Tiffini S wrote: Reason for CRM: Patient son [name] called stating that the patient stayed with his sister for two weeks and they cannot locate the amLODipine  (NORVASC ) 5 MG tablet medication. Patient said she was out, that she used all the medication up , throw away the bottle and have no tablet at this time. Please call [phone] and advise. Thank you.

## 2023-12-14 NOTE — Telephone Encounter (Signed)
 Reached to to pharmacy. Patients insurance would not allow the early fill however she is due again in 3 days and insurance will cover at that time. To bridge her pharmacy is going to fill a 7 day supply and use goodrx coupon to assist with cost which is around $15.  I then spoke to patient's son Garrel and gave him the above update. He appreciated the follow up.

## 2023-12-14 NOTE — Telephone Encounter (Signed)
 Patient's son calling to check on request for refill. Son states he stopped by office today and spoke to front desk about request. Son requesting rx to be filled as patient cannot locate bottle.   Son requesting call back, [phone]

## 2023-12-26 ENCOUNTER — Other Ambulatory Visit: Payer: Self-pay | Admitting: Nurse Practitioner

## 2023-12-26 DIAGNOSIS — Z1231 Encounter for screening mammogram for malignant neoplasm of breast: Secondary | ICD-10-CM

## 2024-01-10 ENCOUNTER — Encounter: Payer: Self-pay | Admitting: Nurse Practitioner

## 2024-01-10 ENCOUNTER — Ambulatory Visit: Admitting: Nurse Practitioner

## 2024-01-10 VITALS — BP 121/80 | HR 83 | Temp 98.5°F | Ht 66.0 in | Wt 179.6 lb

## 2024-01-10 DIAGNOSIS — I739 Peripheral vascular disease, unspecified: Secondary | ICD-10-CM | POA: Diagnosis not present

## 2024-01-10 DIAGNOSIS — R0681 Apnea, not elsewhere classified: Secondary | ICD-10-CM

## 2024-01-10 DIAGNOSIS — I10 Essential (primary) hypertension: Secondary | ICD-10-CM | POA: Diagnosis not present

## 2024-01-10 DIAGNOSIS — E119 Type 2 diabetes mellitus without complications: Secondary | ICD-10-CM | POA: Diagnosis not present

## 2024-01-10 DIAGNOSIS — G3184 Mild cognitive impairment, so stated: Secondary | ICD-10-CM | POA: Diagnosis not present

## 2024-01-10 NOTE — Progress Notes (Signed)
 BP 121/80 (BP Location: Left Arm, Cuff Size: Normal)   Pulse 83   Temp 98.5 F (36.9 C) (Oral)   Ht 5' 6 (1.676 m)   Wt 179 lb 9.6 oz (81.5 kg)   SpO2 97%   BMI 28.99 kg/m    Subjective:    Patient ID: Carol Armstrong, female    DOB: Jan 31, 1941, 83 y.o.   MRN: [id number]  HPI: Carol Armstrong is a 83 y.o. female  Chief Complaint  Patient presents with   Diabetes   Hyperlipidemia   Hypertension   HYPERTENSION / HYPERLIPIDEMIA Satisfied with current treatment? yes Duration of hypertension: years BP monitoring frequency: not checking BP range:  BP medication side effects: no Past BP meds: amlodipine  Duration of hyperlipidemia: years Cholesterol medication side effects: no Cholesterol supplements: none Past cholesterol medications: atorvastain (lipitor) Medication compliance: excellent compliance Aspirin: no Recent stressors: no Recurrent headaches: no Visual changes: no Palpitations: no Dyspnea: no Chest pain: no Lower extremity edema: no Dizzy/lightheaded: no  DIABETES Hypoglycemic episodes:no Polydipsia/polyuria: no Visual disturbance: no Chest pain: no Paresthesias: no Glucose Monitoring: no  Accucheck frequency: Not Checking  Fasting glucose:  Post prandial:  Evening:  Before meals: Taking Insulin?: no  Long acting insulin:  Short acting insulin: Blood Pressure Monitoring: not checking Retinal Examination: Up to Date Foot Exam: Up to Date Diabetic Education: Not Completed Pneumovax: Not up to Date Influenza: Not up to Date Aspirin: no  MEMORY Patient feels like her symptoms have worsened some.  Her son is at her appointment with her today and he agrees that her memory has declined some.  Patient is not taking the Donepezil .  She has decided she just didn't want to take the medication.  Patient's son states that patient recently stayed a few days with his sister who noticed that patient stopped breathing when she was sleeping.  Patient's family  would like her to get a sleep study done.   Relevant past medical, surgical, family and social history reviewed and updated as indicated. Interim medical history since our last visit reviewed. Allergies and medications reviewed and updated.  Review of Systems  Eyes:  Negative for visual disturbance.  Respiratory:  Positive for apnea. Negative for cough, chest tightness and shortness of breath.   Cardiovascular:  Negative for chest pain, palpitations and leg swelling.  Endocrine: Negative for polydipsia and polyuria.  Neurological:  Positive for speech difficulty. Negative for dizziness, numbness and headaches.       Memory deficit    Per HPI unless specifically indicated above     Objective:    BP 121/80 (BP Location: Left Arm, Cuff Size: Normal)   Pulse 83   Temp 98.5 F (36.9 C) (Oral)   Ht 5' 6 (1.676 m)   Wt 179 lb 9.6 oz (81.5 kg)   SpO2 97%   BMI 28.99 kg/m   Wt Readings from Last 3 Encounters:  01/10/24 179 lb 9.6 oz (81.5 kg)  10/10/23 173 lb 9.6 oz (78.7 kg)  07/05/23 174 lb 3.2 oz (79 kg)    Physical Exam Vitals and nursing note reviewed.  Constitutional:      General: She is not in acute distress.    Appearance: Normal appearance. She is normal weight. She is not ill-appearing, toxic-appearing or diaphoretic.  HENT:     Head: Normocephalic.     Right Ear: External ear normal.     Left Ear: External ear normal.     Nose: Nose normal.  Mouth/Throat:     Mouth: Mucous membranes are moist.     Pharynx: Oropharynx is clear.  Eyes:     General:        Right eye: No discharge.        Left eye: No discharge.     Extraocular Movements: Extraocular movements intact.     Conjunctiva/sclera: Conjunctivae normal.     Pupils: Pupils are equal, round, and reactive to light.  Cardiovascular:     Rate and Rhythm: Normal rate and regular rhythm.     Heart sounds: No murmur heard. Pulmonary:     Effort: Pulmonary effort is normal. No respiratory distress.      Breath sounds: Normal breath sounds. No wheezing or rales.  Musculoskeletal:     Cervical back: Normal range of motion and neck supple.  Skin:    General: Skin is warm and dry.     Capillary Refill: Capillary refill takes less than 2 seconds.  Neurological:     General: No focal deficit present.     Mental Status: She is alert and oriented to person, place, and time. Mental status is at baseline.     Comments: Hesitancy with speech.  Studer at times  Psychiatric:        Mood and Affect: Mood normal.        Behavior: Behavior normal.        Thought Content: Thought content normal.        Judgment: Judgment normal.     Results for orders placed or performed in visit on 10/10/23  Comprehensive metabolic panel with GFR   Collection Time: 10/10/23  2:20 PM  Result Value Ref Range   Glucose 89 70 - 99 mg/dL   BUN 15 8 - 27 mg/dL   Creatinine, Ser 9.22 0.57 - 1.00 mg/dL   eGFR 78 >40 fO/fpw/8.26   BUN/Creatinine Ratio 19 12 - 28   Sodium 144 134 - 144 mmol/L   Potassium 4.0 3.5 - 5.2 mmol/L   Chloride 107 (H) 96 - 106 mmol/L   CO2 26 20 - 29 mmol/L   Calcium  9.7 8.7 - 10.3 mg/dL   Total Protein 7.1 6.0 - 8.5 g/dL   Albumin 4.3 3.8 - 4.8 g/dL   Globulin, Total 2.8 1.5 - 4.5 g/dL   Bilirubin Total 1.0 0.0 - 1.2 mg/dL   Alkaline Phosphatase 63 44 - 121 IU/L   AST 27 0 - 40 IU/L   ALT 19 0 - 32 IU/L  Hemoglobin A1c   Collection Time: 10/10/23  2:20 PM  Result Value Ref Range   Hgb A1c MFr Bld 5.9 (H) 4.8 - 5.6 %   Est. average glucose Bld gHb Est-mCnc 123 mg/dL  Lipid panel   Collection Time: 10/10/23  2:20 PM  Result Value Ref Range   Cholesterol, Total 149 100 - 199 mg/dL   Triglycerides 76 0 - 149 mg/dL   HDL 71 >60 mg/dL   VLDL Cholesterol Cal 15 5 - 40 mg/dL   LDL Chol Calc (NIH) 63 0 - 99 mg/dL   Chol/HDL Ratio 2.1 0.0 - 4.4 ratio      Assessment & Plan:   Problem List Items Addressed This Visit       Cardiovascular and Mediastinum   Essential hypertension    Chronic.  Controlled.  Continue with current medication regimen of Amlodipine .  Refills sent today.  Labs ordered today.  Return to clinic in 3 months for reevaluation.  Call sooner if concerns  arise.       PAD (peripheral artery disease) (HCC)   Chronic. Controlled at this time.  Discussed symptoms to monitor for and when to seek follow up.      Relevant Orders   Lipid panel     Endocrine   Diabetes mellitus type 2, uncomplicated (HCC) - Primary   Chronic.  Controlled.  Last A1c was 5.9%.  Foot exam done.  Microalbumin up to date.  Well controlled with diet management.  Controlled without medication.  Labs ordered today.  Return to clinic in 3 months for reevaluation.  Call sooner if concerns arise.       Relevant Orders   Comprehensive metabolic panel with GFR   Hemoglobin A1c     Other   Mild cognitive impairment   Chronic.  No longer taking Donzepil.  Has decided she doesn't want to take more medication.  She continues to enjoy going to church and her bible study.  Follow up in 3 months.  Call sooner if concerns arise.       Other Visit Diagnoses       Apnea       Sleep study ordered to evaluate for OSA.   Relevant Orders   Ambulatory referral to Sleep Studies         Follow up plan: Return in about 3 months (around 04/11/2024) for HTN, HLD, DM2 FU.

## 2024-01-10 NOTE — Assessment & Plan Note (Signed)
 Chronic.  No longer taking Donzepil.  Has decided she doesn't want to take more medication.  She continues to enjoy going to church and her bible study.  Follow up in 3 months.  Call sooner if concerns arise.

## 2024-01-10 NOTE — Assessment & Plan Note (Signed)
 Chronic.  Controlled.  Last A1c was 5.9%.  Foot exam done.  Microalbumin up to date.  Well controlled with diet management.  Controlled without medication.  Labs ordered today.  Return to clinic in 3 months for reevaluation.  Call sooner if concerns arise.

## 2024-01-10 NOTE — Assessment & Plan Note (Signed)
 Chronic.  Controlled.  Continue with current medication regimen of Amlodipine .  Refills sent today.  Labs ordered today.  Return to clinic in 3 months for reevaluation.  Call sooner if concerns arise.

## 2024-01-10 NOTE — Assessment & Plan Note (Signed)
Chronic. Controlled at this time.  Discussed symptoms to monitor for and when to seek follow up.

## 2024-01-11 ENCOUNTER — Ambulatory Visit: Payer: Self-pay | Admitting: Nurse Practitioner

## 2024-01-11 LAB — LIPID PANEL
Chol/HDL Ratio: 1.8 ratio (ref 0.0–4.4)
Cholesterol, Total: 132 mg/dL (ref 100–199)
HDL: 73 mg/dL (ref >39)
LDL Chol Calc (NIH): 47 mg/dL (ref 0–99)
Triglycerides: 52 mg/dL (ref 0–149)
VLDL Cholesterol Cal: 12 mg/dL (ref 5–40)

## 2024-01-11 LAB — COMPREHENSIVE METABOLIC PANEL WITH GFR
ALT: 25 IU/L (ref 0–32)
AST: 28 IU/L (ref 0–40)
Albumin: 4 g/dL (ref 3.7–4.7)
Alkaline Phosphatase: 67 IU/L (ref 44–121)
BUN/Creatinine Ratio: 12 (ref 12–28)
BUN: 8 mg/dL (ref 8–27)
Bilirubin Total: 1.4 mg/dL — ABNORMAL HIGH (ref 0.0–1.2)
CO2: 24 mmol/L (ref 20–29)
Calcium: 9.2 mg/dL (ref 8.7–10.3)
Chloride: 103 mmol/L (ref 96–106)
Creatinine, Ser: 0.66 mg/dL (ref 0.57–1.00)
Globulin, Total: 2.8 g/dL (ref 1.5–4.5)
Glucose: 103 mg/dL — ABNORMAL HIGH (ref 70–99)
Potassium: 3.9 mmol/L (ref 3.5–5.2)
Sodium: 142 mmol/L (ref 134–144)
Total Protein: 6.8 g/dL (ref 6.0–8.5)
eGFR: 88 mL/min/1.73 (ref >59)

## 2024-01-11 LAB — HEMOGLOBIN A1C
Est. average glucose Bld gHb Est-mCnc: 126 mg/dL
Hgb A1c MFr Bld: 6 % — ABNORMAL HIGH (ref 4.8–5.6)

## 2024-01-18 ENCOUNTER — Telehealth: Payer: Self-pay

## 2024-01-18 NOTE — Telephone Encounter (Signed)
 Copied from CRM #[id number]. Topic: Medical Record Request - Provider/Facility Request >> Jan 18, 2024 10:28 AM Wyona SQUIBB wrote: Reason for CRM: [facility] called in to see if a copy of pt insurance card can be faxed over to them so they can complete the order for the Home sleep study for pt.   Pt did not have insurance card when coming in for service.   Fax - [phone]  CB - [phone] - Ext [phone]

## 2024-01-31 ENCOUNTER — Ambulatory Visit
Admission: RE | Admit: 2024-01-31 | Discharge: 2024-01-31 | Disposition: A | Discharge status: Home / Self Care | Source: Ambulatory Visit | Attending: Nurse Practitioner | Admitting: Nurse Practitioner

## 2024-01-31 DIAGNOSIS — Z1231 Encounter for screening mammogram for malignant neoplasm of breast: Secondary | ICD-10-CM | POA: Insufficient documentation

## 2024-02-01 ENCOUNTER — Ambulatory Visit: Payer: Self-pay | Admitting: Family Medicine

## 2024-02-29 DIAGNOSIS — D649 Anemia, unspecified: Secondary | ICD-10-CM | POA: Diagnosis not present

## 2024-02-29 DIAGNOSIS — R29818 Other symptoms and signs involving the nervous system: Secondary | ICD-10-CM | POA: Diagnosis not present

## 2024-02-29 DIAGNOSIS — R531 Weakness: Secondary | ICD-10-CM | POA: Diagnosis not present

## 2024-02-29 DIAGNOSIS — N390 Urinary tract infection, site not specified: Secondary | ICD-10-CM | POA: Diagnosis not present

## 2024-03-02 DIAGNOSIS — G4730 Sleep apnea, unspecified: Secondary | ICD-10-CM | POA: Diagnosis not present

## 2024-03-11 ENCOUNTER — Telehealth: Payer: Self-pay | Admitting: Nurse Practitioner

## 2024-03-11 NOTE — Telephone Encounter (Signed)
 Please let patient know that her sleep study came back showing severe sleep apnea.  I do recommend she start CPAP.  If she agrees, I will order the supplies.

## 2024-03-12 NOTE — Telephone Encounter (Signed)
 Called and LVM asking for patient or her daughter to please return my call.   OK for E2C2 to speak with patient and advise them of message. Please find out if patient would like to have CPAP machine and supplies ordered.

## 2024-03-13 NOTE — Telephone Encounter (Signed)
 Called and LVM asking for patient or her daughter to please return my call.   OK for E2C2 to speak with patient and advise them of message. Please find out if patient would like to have CPAP machine and supplies ordered.

## 2024-03-14 ENCOUNTER — Telehealth: Payer: Self-pay

## 2024-03-14 NOTE — Telephone Encounter (Unsigned)
 Copied from CRM [id number]. Topic: Clinical - Lab/Test Results >> Mar 14, 2024  2:53 PM [name] wrote: Reason for CRM: Patients son [name] is calling in regarding sleep study results. Advised per chart note Please let patient know that her sleep study came back showing severe sleep apnea.  I do recommend she start CPAP.  If she agrees, I will order the supplies.  Son states please order CPAP machine & supplies. If needing to get in touch please contact # [phone]

## 2024-03-14 NOTE — Telephone Encounter (Signed)
 Can we order CPAP with auto titration.

## 2024-03-15 NOTE — Telephone Encounter (Signed)
 CPAP ordered on [facility].

## 2024-03-18 ENCOUNTER — Encounter: Payer: Self-pay | Admitting: Nurse Practitioner

## 2024-03-29 ENCOUNTER — Telehealth: Payer: Self-pay

## 2024-03-29 NOTE — Telephone Encounter (Signed)
 Copied from CRM [id number]. Topic: Clinical - Lab/Test Results >> Mar 29, 2024  9:40 AM Willma SAUNDERS wrote: Reason for CRM: Patients daughter Verneita Milroy calling to speak with [place]. Would like to discuss her mothers Sleep Study results. (spoke with her brother on 09/25, see encounter).  Verneita can be reached at [phone]

## 2024-04-12 DIAGNOSIS — D649 Anemia, unspecified: Secondary | ICD-10-CM | POA: Diagnosis not present

## 2024-04-12 DIAGNOSIS — R531 Weakness: Secondary | ICD-10-CM | POA: Diagnosis not present

## 2024-04-12 DIAGNOSIS — N390 Urinary tract infection, site not specified: Secondary | ICD-10-CM | POA: Diagnosis not present

## 2024-04-15 ENCOUNTER — Ambulatory Visit (INDEPENDENT_AMBULATORY_CARE_PROVIDER_SITE_OTHER): Admitting: Nurse Practitioner

## 2024-04-15 VITALS — BP 121/72 | HR 81 | Temp 97.9°F | Ht 66.0 in | Wt 185.2 lb

## 2024-04-15 DIAGNOSIS — G3184 Mild cognitive impairment, so stated: Secondary | ICD-10-CM | POA: Diagnosis not present

## 2024-04-15 DIAGNOSIS — E7800 Pure hypercholesterolemia, unspecified: Secondary | ICD-10-CM

## 2024-04-15 DIAGNOSIS — Z0000 Encounter for general adult medical examination without abnormal findings: Secondary | ICD-10-CM | POA: Diagnosis not present

## 2024-04-15 DIAGNOSIS — Z23 Encounter for immunization: Secondary | ICD-10-CM | POA: Diagnosis not present

## 2024-04-15 DIAGNOSIS — R350 Frequency of micturition: Secondary | ICD-10-CM

## 2024-04-15 DIAGNOSIS — I10 Essential (primary) hypertension: Secondary | ICD-10-CM

## 2024-04-15 DIAGNOSIS — E119 Type 2 diabetes mellitus without complications: Secondary | ICD-10-CM

## 2024-04-15 NOTE — Assessment & Plan Note (Signed)
 Chronic.  Controlled.  Continue with current medication regimen of Amlodipine .  Refills sent today.  Labs ordered today.  Return to clinic in 3 months for reevaluation.  Call sooner if concerns arise.

## 2024-04-15 NOTE — Assessment & Plan Note (Signed)
Chronic.  Controlled.  Continue with current medication regimen on Atorvastatin '20mg'$  daily.  Labs ordered today.  Return to clinic in 3 months for reevaluation.  Call sooner if concerns arise.  ? ?

## 2024-04-15 NOTE — Progress Notes (Signed)
 BP 121/72   Pulse 81   Temp 97.9 F (36.6 C) (Oral)   Ht 5' 6 (1.676 m)   Wt 185 lb 3.2 oz (84 kg)   SpO2 95%   BMI 29.89 kg/m    Subjective:    Patient ID: Carol Armstrong, female    DOB: November 19, 1940, 84 y.o.   MRN: [id number]  NOTE WRITTEN BY DNP STUDENT.  ASSESSMENT AND PLAN OF CARE REVIEWED WITH STUDENT, AGREE WITH ABOVE FINDINGS AND PLAN.   Chief Complaint  Patient presents with   Diabetes    Eye exam requested from [facility]   Hyperlipidemia   Hypertension   HPI: Carol Armstrong is a 83 y.o. female here today for a follow-up for HTN, HLD, and DM2. Pt reports doing well on medications, denies side effects. Denies headache, shortness of breath, dizziness or lightheadedness. Pts BP running 120s/70s at home. Denies polydypsia, polyphagia, or polyuria. Pt's son states she had a slip on the floor where she lowered herself down, but hasn't been complaining of injury since. States she did not hit her head. Pt's son concerned for UTI, family has noticed pt doesn't go to the restroom as often and they are concerned she is holding it in and might have an infection. Pt denies urinary pain or discomfort, pt's son states she has not been complaining recently of urinary discomfort or pain. Pt to get flu shot today. Son states she sees ophthalmology regularly and is up to date on eye exams.   Relevant past medical, surgical, family and social history reviewed and updated as indicated. Interim medical history since our last visit reviewed. Allergies and medications reviewed and updated.  Review of Systems  Eyes:  Negative for visual disturbance.  Respiratory:  Negative for cough, chest tightness and shortness of breath.   Cardiovascular:  Positive for leg swelling. Negative for chest pain and palpitations.  Endocrine: Negative for polydipsia and polyuria.  Genitourinary:  Negative for difficulty urinating, frequency, pelvic pain and urgency.  Neurological:  Negative for dizziness, numbness  and headaches.       Memory deficit    Per HPI unless specifically indicated above     Objective:    BP 121/72   Pulse 81   Temp 97.9 F (36.6 C) (Oral)   Ht 5' 6 (1.676 m)   Wt 185 lb 3.2 oz (84 kg)   SpO2 95%   BMI 29.89 kg/m   Wt Readings from Last 3 Encounters:  04/15/24 185 lb 3.2 oz (84 kg)  01/10/24 179 lb 9.6 oz (81.5 kg)  10/10/23 173 lb 9.6 oz (78.7 kg)    Physical Exam Vitals and nursing note reviewed.  Constitutional:      General: She is not in acute distress.    Appearance: Normal appearance. She is normal weight. She is not ill-appearing, toxic-appearing or diaphoretic.  HENT:     Head: Normocephalic.     Right Ear: External ear normal.     Left Ear: External ear normal.     Nose: Nose normal.     Mouth/Throat:     Mouth: Mucous membranes are moist.     Pharynx: Oropharynx is clear.  Eyes:     General:        Right eye: No discharge.        Left eye: No discharge.     Extraocular Movements: Extraocular movements intact.     Conjunctiva/sclera: Conjunctivae normal.     Pupils: Pupils are equal,  round, and reactive to light.  Cardiovascular:     Rate and Rhythm: Normal rate and regular rhythm.     Heart sounds: No murmur heard. Pulmonary:     Effort: Pulmonary effort is normal. No respiratory distress.     Breath sounds: Normal breath sounds. No wheezing or rales.  Abdominal:     Tenderness: There is no abdominal tenderness.  Musculoskeletal:     Cervical back: Normal range of motion and neck supple.     Right lower leg: Edema present.     Left lower leg: Edema present.     Comments: Mild non-pitting LE edema  Lymphadenopathy:     Cervical: No cervical adenopathy.  Skin:    General: Skin is warm and dry.     Capillary Refill: Capillary refill takes less than 2 seconds.  Neurological:     General: No focal deficit present.     Mental Status: She is alert. Mental status is at baseline.     Comments: Hesitancy with speech.  Studer at times   Psychiatric:        Mood and Affect: Mood normal.        Behavior: Behavior normal.        Thought Content: Thought content normal.        Judgment: Judgment normal.     Results for orders placed or performed in visit on 01/10/24  Comprehensive metabolic panel with GFR   Collection Time: 01/10/24  1:24 PM  Result Value Ref Range   Glucose 103 (H) 70 - 99 mg/dL   BUN 8 8 - 27 mg/dL   Creatinine, Ser 9.33 0.57 - 1.00 mg/dL   eGFR 88 >40 fO/fpw/8.26   BUN/Creatinine Ratio 12 12 - 28   Sodium 142 134 - 144 mmol/L   Potassium 3.9 3.5 - 5.2 mmol/L   Chloride 103 96 - 106 mmol/L   CO2 24 20 - 29 mmol/L   Calcium  9.2 8.7 - 10.3 mg/dL   Total Protein 6.8 6.0 - 8.5 g/dL   Albumin 4.0 3.7 - 4.7 g/dL   Globulin, Total 2.8 1.5 - 4.5 g/dL   Bilirubin Total 1.4 (H) 0.0 - 1.2 mg/dL   Alkaline Phosphatase 67 44 - 121 IU/L   AST 28 0 - 40 IU/L   ALT 25 0 - 32 IU/L  Hemoglobin A1c   Collection Time: 01/10/24  1:24 PM  Result Value Ref Range   Hgb A1c MFr Bld 6.0 (H) 4.8 - 5.6 %   Est. average glucose Bld gHb Est-mCnc 126 mg/dL  Lipid panel   Collection Time: 01/10/24  1:24 PM  Result Value Ref Range   Cholesterol, Total 132 100 - 199 mg/dL   Triglycerides 52 0 - 149 mg/dL   HDL 73 >60 mg/dL   VLDL Cholesterol Cal 12 5 - 40 mg/dL   LDL Chol Calc (NIH) 47 0 - 99 mg/dL   Chol/HDL Ratio 1.8 0.0 - 4.4 ratio      Assessment & Plan:   Problem List Items Addressed This Visit       Cardiovascular and Mediastinum   Essential hypertension   Chronic.  Controlled.  Continue with current medication regimen of Amlodipine .  Refills sent today.  Labs ordered today.  Return to clinic in 3 months for reevaluation.  Call sooner if concerns arise.         Endocrine   Diabetes mellitus type 2, uncomplicated (HCC)   Chronic.  Controlled.  Last A1c was 6%.  Microalbumin today.  Well controlled with diet management.  Controlled without medication.  Labs ordered today.  Return to clinic in 3 months  for reevaluation.  Call sooner if concerns arise.       Relevant Orders   Comp Met (CMET)   HgB A1c   Microalbumin, Urine Waived     Other   Hypercholesteremia   Chronic.  Controlled.  Continue with current medication regimen on Atorvastatin  20mg  daily.  Labs ordered today.  Return to clinic in 3 months for reevaluation.  Call sooner if concerns arise.       Mild cognitive impairment   Chronic.  No longer taking Donzepil.  Has decided she doesn't want to take more medication.  She continues to enjoy going to church and her bible study.  Follow up in 3 months.  Call sooner if concerns arise.       Other Visit Diagnoses       Encounter for Medicare annual wellness exam    -  Primary     Urinary frequency       UA today. Will make recommendations based on results. Drink plenty of fluids.   Relevant Orders   Urinalysis, Routine w reflex microscopic     Need for influenza vaccination       Relevant Orders   Flu vaccine HIGH DOSE PF(Fluzone Trivalent)         Follow up plan: Return in about 3 months (around 07/16/2024) for HTN, HLD, DM2 FU.

## 2024-04-15 NOTE — Assessment & Plan Note (Signed)
 Chronic.  Controlled.  Last A1c was 6%.  Microalbumin today.  Well controlled with diet management.  Controlled without medication.  Labs ordered today.  Return to clinic in 3 months for reevaluation.  Call sooner if concerns arise.

## 2024-04-15 NOTE — Assessment & Plan Note (Signed)
 Chronic.  No longer taking Donzepil.  Has decided she doesn't want to take more medication.  She continues to enjoy going to church and her bible study.  Follow up in 3 months.  Call sooner if concerns arise.

## 2024-04-15 NOTE — Progress Notes (Signed)
 Subjective:   Carol Armstrong is a 83 y.o. female who presents for Medicare Annual (Subsequent) preventive examination.  Visit Complete: In person  Patient Medicare AWV questionnaire was completed by the patient on 04/15/24; I have confirmed that all information answered by patient is correct and no changes since this date.  Cardiac Risk Factors include: advanced age (>54men, >105 women);diabetes mellitus;dyslipidemia;hypertension     Objective:    Today's Vitals   04/15/24 1306  BP: 121/72  Pulse: 81  Temp: 97.9 F (36.6 C)  TempSrc: Oral  SpO2: 95%  Weight: 185 lb 3.2 oz (84 kg)  Height: 5' 6 (1.676 m)  PainSc: 0-No pain   Body mass index is 29.89 kg/m.     04/15/2024    1:19 PM 02/14/2022   12:36 PM 02/12/2021    1:50 PM 02/10/2020   11:21 AM 10/07/2019    8:40 AM  Advanced Directives  Does Patient Have a Medical Advance Directive? Yes Yes No No No  Type of Estate Agent of [place];Living will Healthcare Power of Attorney     Does patient want to make changes to medical advance directive? No - Patient declined      Copy of Healthcare Power of Attorney in Chart? No - copy requested Yes - validated most recent copy scanned in chart (See row information)     Would patient like information on creating a medical advance directive?  No - Patient declined   No - Patient declined    Current Medications (verified) Outpatient Encounter Medications as of 04/15/2024  Medication Sig   amLODipine  (NORVASC ) 5 MG tablet Take 1 tablet by mouth daily   atorvastatin  (LIPITOR) 20 MG tablet TAKE 1 TABLET(20 MG) BY MOUTH DAILY   Elderberry-Vitamin C-Zinc (ELDERBERRY IMMUNE HEALTH GUMMY PO) Take 2 each by mouth daily.   Multiple Vitamin (MULTIVITAMIN) capsule Take 1 capsule by mouth daily.   No facility-administered encounter medications on file as of 04/15/2024.    Allergies (verified) Latex   History: Past Medical History:  Diagnosis Date   Arthritis     Hypertension    Prediabetes    Wears dentures    partial upper and lower   Past Surgical History:  Procedure Laterality Date   COLONOSCOPY     COLONOSCOPY WITH PROPOFOL  N/A 10/07/2019   Procedure: COLONOSCOPY WITH PROPOFOL ;  Surgeon: Jinny Carmine, MD;  Location: [facility];  Service: Endoscopy;  Laterality: N/A;  Latex  priority 4   POLYPECTOMY  10/07/2019   Procedure: POLYPECTOMY;  Surgeon: Jinny Carmine, MD;  Location: [facility];  Service: Endoscopy;;   TUBAL LIGATION     Family History  Problem Relation Age of Onset   Hypertension Mother    Prostate cancer Father    Hypertension Father    Hypertension Sister    Hypertension Brother    Hypertension Sister    Hypertension Sister    Hypertension Sister    Breast cancer Neg Hx    Social History   Socioeconomic History   Marital status: Divorced    Spouse name: Not on file   Number of children: Not on file   Years of education: Not on file   Highest education level: GED or equivalent  Occupational History   Occupation: retired  Tobacco Use   Smoking status: Former   Smokeless tobacco: Never   Tobacco comments:    as teenager  Advertising Account Planner   Vaping status: Never Used  Substance and Sexual Activity  Alcohol use: Not Currently    Comment: occasinally wine   Drug use: Never   Sexual activity: Not Currently  Other Topics Concern   Not on file  Social History Narrative   Not on file   Social Drivers of Health   Financial Resource Strain: Low Risk  (04/15/2024)   Overall Financial Resource Strain (CARDIA)    Difficulty of Paying Living Expenses: Not hard at all  Food Insecurity: No Food Insecurity (04/15/2024)   Hunger Vital Sign    Worried About Running Out of Food in the Last Year: Never true    Ran Out of Food in the Last Year: Never true  Transportation Needs: No Transportation Needs (04/15/2024)   PRAPARE - Administrator, Civil Service (Medical): No    Lack of Transportation  (Non-Medical): No  Physical Activity: Sufficiently Active (04/15/2024)   Exercise Vital Sign    Days of Exercise per Week: 7 days    Minutes of Exercise per Session: 30 min  Stress: No Stress Concern Present (04/15/2024)   [employer] of Occupational Health - Occupational Stress Questionnaire    Feeling of Stress: Only a little  Social Connections: Moderately Integrated (04/15/2024)   Social Connection and Isolation Panel    Frequency of Communication with Friends and Family: More than three times a week    Frequency of Social Gatherings with Friends and Family: More than three times a week    Attends Religious Services: More than 4 times per year    Active Member of [employer] or Organizations: Yes    Attends Banker Meetings: More than 4 times per year    Marital Status: Widowed    Tobacco Counseling Counseling given: Not Answered Tobacco comments: as teenager   Clinical Intake:  Pre-visit preparation completed: Yes  Pain : No/denies pain Pain Score: 0-No pain     BMI - recorded: 29.91 Nutritional Status: BMI 25 -29 Overweight Nutritional Risks: None Diabetes: Yes CBG done?: No Did pt. bring in CBG monitor from home?: No  How often do you need to have someone help you when you read instructions, pamphlets, or other written materials from your doctor or pharmacy?: 4 - Often What is the last grade level you completed in school?: GED, Associate's Degree  Interpreter Needed?: No      Activities of Daily Living    04/15/2024    1:14 PM  In your present state of health, do you have any difficulty performing the following activities:  Hearing? 0  Vision? 0  Difficulty concentrating or making decisions? 1  Walking or climbing stairs? 0  Dressing or bathing? 1  Doing errands, shopping? 1  Preparing Food and eating ? Y  Using the Toilet? N  In the past six months, have you accidently leaked urine? N  Do you have problems with loss of bowel control? N   Managing your Medications? Y  Managing your Finances? Y  Housekeeping or managing your Housekeeping? Y    Patient Care Team: Melvin Pao, NP as PCP - General  Indicate any recent Medical Services you may have received from other than [facility] providers in the past year (date may be approximate).     Assessment:   This is a routine wellness examination for [facility].  Hearing/Vision screen No results found.   Goals Addressed   None    Depression Screen    04/15/2024    1:42 PM 01/10/2024    1:10 PM 07/05/2023    1:20  PM 04/04/2023    1:30 PM 11/01/2022    3:04 PM 04/04/2022    3:56 PM 02/14/2022   12:45 PM  PHQ 2/9 Scores  PHQ - 2 Score 2 0 2 0 0 0 0  PHQ- 9 Score 5 0 3 0 1 0 0    Fall Risk    04/15/2024    1:42 PM 01/10/2024    1:10 PM 10/10/2023    2:01 PM 04/04/2023    1:30 PM 04/04/2022    3:56 PM  Fall Risk   Falls in the past year? 1 0 0 0 0  Number falls in past yr: 0 0 0 0 0  Injury with Fall? 0 0 1 0 0  Risk for fall due to : No Fall Risks No Fall Risks Impaired balance/gait;Impaired mobility No Fall Risks No Fall Risks  Follow up Falls evaluation completed Falls evaluation completed Falls evaluation completed Falls evaluation completed Falls evaluation completed      Data saved with a previous flowsheet row definition    MEDICARE RISK AT HOME: Medicare Risk at Home Any stairs in or around the home?: Yes If so, are there any without handrails?: No Home free of loose throw rugs in walkways, pet beds, electrical cords, etc?: Yes Adequate lighting in your home to reduce risk of falls?: Yes Life alert?: No Use of a cane, walker or w/c?: No Grab bars in the bathroom?: Yes Shower chair or bench in shower?: No Elevated toilet seat or a handicapped toilet?: Yes  TIMED UP AND GO:  Was the test performed?  Yes  Length of time to ambulate 10 feet: 6 sec Gait slow and steady without use of assistive device    Cognitive Function:    04/04/2023    3:01 PM  09/01/2022    4:48 PM 05/28/2021   11:38 AM 09/17/2020    2:56 PM  MMSE - Mini Mental State Exam  Orientation to time 0 3 5 5   Orientation to Place 1 5 5 5   Registration 2 3 1 3   Attention/ Calculation 0 0 0 0  Recall 2 3 2 2   Language- name 2 objects 0 2 2 2   Language- repeat 0 1 1 1   Language- follow 3 step command 0 3 3 3   Language- read & follow direction  1 1 1   Write a sentence 0 0 1 1  Copy design  0 0 0  Total score  21 21 23         04/15/2024    1:21 PM 02/14/2022   12:38 PM 02/12/2021    1:53 PM 02/10/2020   11:25 AM  6CIT Screen  What Year? 0 points 0 points 0 points 0 points  What month? 0 points 0 points 0 points 0 points  What time? 0 points 0 points 3 points 0 points  Count back from 20 4 points 2 points 0 points 4 points  Months in reverse 4 points 4 points 2 points 2 points  Repeat phrase 6 points 4 points 8 points 4 points  Total Score 14 points 10 points 13 points 10 points    Immunizations Immunization History  Administered Date(s) Administered   Fluad Quad(high Dose 65+) 03/05/2020   INFLUENZA, HIGH DOSE SEASONAL PF 04/09/2018, 03/28/2023   Influenza,inj,Quad PF,6+ Mos 03/23/2022   Influenza-Unspecified 06/11/2015, 05/07/2016, 06/04/2019   PFIZER(Purple Top)SARS-COV-2 Vaccination 10/21/2019, 11/11/2019, 11/11/2020   Pfizer(Comirnaty)Fall Seasonal Vaccine 12 years and older 04/04/2023   Pneumococcal Conjugate-13 12/12/2015   Pneumococcal  Polysaccharide-23 08/29/2019   Zoster Recombinant(Shingrix) 03/27/2021    TDAP status: Due, Education has been provided regarding the importance of this vaccine. Advised may receive this vaccine at local pharmacy or Health Dept. Aware to provide a copy of the vaccination record if obtained from local pharmacy or Health Dept. Verbalized acceptance and understanding.  Flu Vaccine status: Up to date  Pneumococcal vaccine status: Up to date  Covid-19 vaccine status: Declined, Education has been provided regarding the  importance of this vaccine but patient still declined. Advised may receive this vaccine at local pharmacy or Health Dept.or vaccine clinic. Aware to provide a copy of the vaccination record if obtained from local pharmacy or Health Dept. Verbalized acceptance and understanding.  Qualifies for Shingles Vaccine? Yes   Zostavax completed Yes   Shingrix Completed?: Yes  Screening Tests Health Maintenance  Topic Date Due   Influenza Vaccine  01/19/2024   OPHTHALMOLOGY EXAM  02/28/2024   Diabetic kidney evaluation - Urine ACR  04/03/2024   COVID-19 Vaccine (7 - 2025-26 season) 05/01/2024 (Originally 02/19/2024)   DTaP/Tdap/Td (1 - Tdap) 10/09/2024 (Originally 01/02/1960)   HEMOGLOBIN A1C  07/12/2024   Diabetic kidney evaluation - eGFR measurement  01/09/2025   FOOT EXAM  01/09/2025   Mammogram  01/30/2025   Medicare Annual Wellness (AWV)  04/15/2025   Pneumococcal Vaccine: 50+ Years  Completed   DEXA SCAN  Completed   Zoster Vaccines- Shingrix  Completed   Meningococcal B Vaccine  Aged Out   Colonoscopy  Discontinued   Hepatitis C Screening  Discontinued    Health Maintenance  Health Maintenance Due  Topic Date Due   Influenza Vaccine  01/19/2024   OPHTHALMOLOGY EXAM  02/28/2024   Diabetic kidney evaluation - Urine ACR  04/03/2024    Colorectal cancer screening: Type of screening: Colonoscopy. Completed 10/07/19. Repeat every 10 years  Mammogram status: Completed 01/31/24. Repeat every year   Lung Cancer Screening: (Low Dose CT Chest recommended if Age 98-80 years, 20 pack-year currently smoking OR have quit w/in 15years.) does not qualify.   Lung Cancer Screening Referral: N/A  Additional Screening:  Hepatitis C Screening: does qualify; Completed 05/28/21  Vision Screening: Recommended annual ophthalmology exams for early detection of glaucoma and other disorders of the eye. Is the patient up to date with their annual eye exam?  Yes  Who is the provider or what is the name of  the office in which the patient attends annual eye exams? [facility] If pt is not established with a provider, would they like to be referred to a provider to establish care? N/A- patient already established.   Dental Screening: Recommended annual dental exams for proper oral hygiene  Diabetic Foot Exam: Diabetic Foot Exam: Completed 01/10/24  Community Resource Referral / Chronic Care Management: CRR required this visit?  No   CCM required this visit?  No     Plan:     I have personally reviewed and noted the following in the patient's chart:   Medical and social history Use of alcohol, tobacco or illicit drugs  Current medications and supplements including opioid prescriptions. Patient is not currently taking opioid prescriptions. Functional ability and status Nutritional status Physical activity Advanced directives List of other physicians Hospitalizations, surgeries, and ER visits in previous 12 months Vitals Screenings to include cognitive, depression, and falls Referrals and appointments  In addition, I have reviewed and discussed with patient certain preventive protocols, quality metrics, and best practice recommendations. A written personalized care plan for  preventive services as well as general preventive health recommendations were provided to patient.     Laymon LOISE Metro, CMA   04/15/2024   After Visit Summary: (In Person-Printed) AVS printed and given to the patient

## 2024-04-16 ENCOUNTER — Telehealth: Payer: Self-pay

## 2024-04-16 ENCOUNTER — Ambulatory Visit: Payer: Self-pay | Admitting: Nurse Practitioner

## 2024-04-16 LAB — COMPREHENSIVE METABOLIC PANEL WITH GFR
ALT: 22 IU/L (ref 0–32)
AST: 24 IU/L (ref 0–40)
Albumin: 4 g/dL (ref 3.7–4.7)
Alkaline Phosphatase: 74 IU/L (ref 48–129)
BUN/Creatinine Ratio: 21 (ref 12–28)
BUN: 15 mg/dL (ref 8–27)
Bilirubin Total: 0.9 mg/dL (ref 0.0–1.2)
CO2: 25 mmol/L (ref 20–29)
Calcium: 9.2 mg/dL (ref 8.7–10.3)
Chloride: 104 mmol/L (ref 96–106)
Creatinine, Ser: 0.72 mg/dL (ref 0.57–1.00)
Globulin, Total: 2.6 g/dL (ref 1.5–4.5)
Glucose: 97 mg/dL (ref 70–99)
Potassium: 4.1 mmol/L (ref 3.5–5.2)
Sodium: 142 mmol/L (ref 134–144)
Total Protein: 6.6 g/dL (ref 6.0–8.5)
eGFR: 84 mL/min/1.73 (ref >59)

## 2024-04-16 LAB — HEMOGLOBIN A1C
Est. average glucose Bld gHb Est-mCnc: 128 mg/dL
Hgb A1c MFr Bld: 6.1 % — ABNORMAL HIGH (ref 4.8–5.6)

## 2024-04-16 NOTE — Telephone Encounter (Signed)
 Called and spoke with the patient's son. Let him know that they could come by and pick up a hat for the patient to be able to obtain the urine specimen.

## 2024-04-16 NOTE — Telephone Encounter (Signed)
 Copied from CRM [id number]. Topic: Clinical - Request for Lab/Test Order >> Apr 16, 2024  9:10 AM Tobias CROME wrote: Reason for CRM: Patient's daughter in law, Karna Sacks calling stating patient has dementia and does not know how to urinate in a cup. Patient was given a cup for urine sample. Daughter in law is a engineer, civil (consulting) and is requesting patient be given a hat for the urine sample and Ms. Karna can assist patient in providing urine sample.  Please reach out to son, [name] Sacks, [phone]

## 2024-04-19 LAB — MICROALBUMIN, URINE WAIVED
Creatinine, Urine Waived: 10 mg/dL (ref 10–300)
Microalb, Ur Waived: 10 mg/L (ref 0–19)

## 2024-04-20 DEATH — deceased

## 2024-04-22 ENCOUNTER — Ambulatory Visit
Admission: RE | Admit: 2024-04-22 | Discharge: 2024-04-22 | Disposition: A | Discharge status: Home / Self Care | Source: Ambulatory Visit | Attending: Emergency Medicine | Admitting: Emergency Medicine

## 2024-04-22 VITALS — BP 133/57 | HR 99 | Temp 98.2°F | Resp 20

## 2024-04-22 DIAGNOSIS — R21 Rash and other nonspecific skin eruption: Secondary | ICD-10-CM | POA: Diagnosis not present

## 2024-04-22 MED ORDER — TRIAMCINOLONE ACETONIDE 0.1 % EX CREA: 1.0000 | TOPICAL_CREAM | Freq: Two times a day (BID) | CUTANEOUS | 0 refills | Status: AC

## 2024-04-22 NOTE — ED Triage Notes (Signed)
 Patient/son states rash to left and right sides since Saturday that initially looked like welts but now there is just some mild streaking.

## 2024-04-22 NOTE — Discharge Instructions (Signed)
 Today you are being treated for a rash that appears inflammatory, no signs of infection, rash is on both sides of body therefore it cannot be shingles  Apply topical triamcinolone cream which is a steroid cream to reduce inflammation and irritation, use twice daily until cleared   If itching occurs you may continue use of topical calamine or Benadryl cream to help manage itching, you may also continue oral Claritin and Zyrtec  Please avoid long exposures to heat such as a hot steamy shower or being outside as this may cause further irritation to your rash  You may follow-up with his urgent care as needed if symptoms persist or worsen

## 2024-04-23 NOTE — ED Provider Notes (Signed)
 Carol Armstrong    CSN: [id number] Arrival date & time: 04/22/24  1847      History   Chief Complaint Chief Complaint  Patient presents with   Rash    Notice rash around waist. Rash is itching. - Entered by patient    HPI Carol Armstrong is a 83 y.o. female.   Patient presents for rash to the bilateral waist beginning 1 day ago.  Described as whelps which has resolved and now only red streaking.  Denies pain, itching, drainage, fever.  Denies changes in toiletries diet medication or recent travel.  No sick contact has similar symptoms.  Has not attempted treatment.   Past Medical History:  Diagnosis Date   Arthritis    Hypertension    Prediabetes    Wears dentures    partial upper and lower    Patient Active Problem List   Diagnosis Date Noted   Aphasia 09/02/2022   PAD (peripheral artery disease) 12/27/2021   Mild cognitive impairment 05/28/2021   Diabetes mellitus type 2, uncomplicated (HCC) 12/17/2020   Forgetfulness 09/17/2020   Hypercholesteremia 03/05/2020   History of colonic polyps    Polyp of transverse colon    Essential hypertension 08/01/2019    Past Surgical History:  Procedure Laterality Date   COLONOSCOPY     COLONOSCOPY WITH PROPOFOL  N/A 10/07/2019   Procedure: COLONOSCOPY WITH PROPOFOL ;  Surgeon: Jinny Carmine, MD;  Location: [facility];  Service: Endoscopy;  Laterality: N/A;  Latex  priority 4   POLYPECTOMY  10/07/2019   Procedure: POLYPECTOMY;  Surgeon: Jinny Carmine, MD;  Location: [facility];  Service: Endoscopy;;   TUBAL LIGATION      OB History   No obstetric history on file.      Home Medications    Prior to Admission medications   Medication Sig Start Date End Date Taking? Authorizing Provider  triamcinolone cream (KENALOG) 0.1 % Apply 1 Application topically 2 (two) times daily. 04/22/24  Yes Teresa Shelba SAUNDERS, NP  amLODipine  (NORVASC ) 5 MG tablet Take 1 tablet by mouth daily 10/10/23   Melvin Pao, NP   atorvastatin  (LIPITOR) 20 MG tablet TAKE 1 TABLET(20 MG) BY MOUTH DAILY 10/10/23   Melvin Pao, NP  Elderberry-Vitamin C-Zinc (ELDERBERRY IMMUNE HEALTH GUMMY PO) Take 2 each by mouth daily. 03/31/24   [provider]  Multiple Vitamin (MULTIVITAMIN) capsule Take 1 capsule by mouth daily.    [provider]    Family History Family History  Problem Relation Age of Onset   Hypertension Mother    Prostate cancer Father    Hypertension Father    Hypertension Sister    Hypertension Brother    Hypertension Sister    Hypertension Sister    Hypertension Sister    Breast cancer Neg Hx     Social History Social History   Tobacco Use   Smoking status: Former   Smokeless tobacco: Never   Tobacco comments:    as teenager  Advertising Account Planner   Vaping status: Never Used  Substance Use Topics   Alcohol use: Not Currently    Comment: occasinally wine   Drug use: Never     Allergies   Latex   Review of Systems Review of Systems  Skin:  Positive for rash.     Physical Exam Triage Vital Signs ED Triage Vitals [04/22/24 1912]  Encounter Vitals Group     BP (!) 133/57     Girls Systolic BP Percentile  Girls Diastolic BP Percentile      Boys Systolic BP Percentile      Boys Diastolic BP Percentile      Pulse Rate 99     Resp 20     Temp 98.2 F (36.8 C)     Temp Source Oral     SpO2 99 %     Weight      Height      Head Circumference      Peak Flow      Pain Score 0     Pain Loc      Pain Education      Exclude from Growth Chart    No data found.  Updated Vital Signs BP (!) 133/57 (BP Location: Left Arm)   Pulse 99   Temp 98.2 F (36.8 C) (Oral)   Resp 20   SpO2 99%   Visual Acuity Right Eye Distance:   Left Eye Distance:   Bilateral Distance:    Right Eye Near:   Left Eye Near:    Bilateral Near:     Physical Exam Constitutional:      Appearance: Normal appearance.  Eyes:     Extraocular Movements: Extraocular movements  intact.  Pulmonary:     Effort: Pulmonary effort is normal.  Skin:    Comments: Erythematous linear rash present to the bilateral hips, no drainage tenderness noted  Neurological:     Mental Status: She is alert and oriented to person, place, and time. Mental status is at baseline.      UC Treatments / Results  Labs (all labs ordered are listed, but only abnormal results are displayed) Labs Reviewed - No data to display  EKG   Radiology No results found.  Procedures Procedures (including critical care time)  Medications Ordered in UC Medications - No data to display  Initial Impression / Assessment and Plan / UC Course  I have reviewed the triage vital signs and the nursing notes.  Pertinent labs & imaging results that were available during my care of the patient were reviewed by me and considered in my medical decision making (see chart for details).  Rash  Unknown etiology, no signs of infection, occurring bilaterally therefore ruling out shingles, will move forward with conservative treatment ordered for inflammatory process, discussed with family, prescribed triamcinolone cream, recommended nonpharmacological supportive care and advised follow-up for persisting or worsening symptoms Final Clinical Impressions(s) / UC Diagnoses   Final diagnoses:  Rash     Discharge Instructions      Today you are being treated for a rash that appears inflammatory, no signs of infection, rash is on both sides of body therefore it cannot be shingles  Apply topical triamcinolone cream which is a steroid cream to reduce inflammation and irritation, use twice daily until cleared   If itching occurs you may continue use of topical calamine or Benadryl cream to help manage itching, you may also continue oral Claritin and Zyrtec  Please avoid long exposures to heat such as a hot steamy shower or being outside as this may cause further irritation to your rash  You may follow-up with  his urgent care as needed if symptoms persist or worsen     ED Prescriptions     Medication Sig Dispense Auth. Provider   triamcinolone cream (KENALOG) 0.1 % Apply 1 Application topically 2 (two) times daily. 30 g Teresa Shelba SAUNDERS, NP      PDMP not reviewed this encounter.   Teresa Shelba  R, NP 04/23/24 [id number]

## 2024-05-08 ENCOUNTER — Telehealth: Payer: Self-pay

## 2024-05-08 NOTE — Telephone Encounter (Signed)
 See other encounter.    Copied from CRM #[id number]. Topic: Clinical - Medical Advice >> May 07, 2024  4:55 PM Nathanel BROCKS wrote: Reason for CRM: pts daughter called about the cpap machine. Pt had a sleep study but she is yet to get the machine and the study was in September.  Can you please get in touch with pt and home health to get one ordered.

## 2024-05-10 ENCOUNTER — Telehealth: Payer: Self-pay | Admitting: Nurse Practitioner

## 2024-05-10 NOTE — Telephone Encounter (Signed)
 Returned call to daughter. She is aware that the order was sent to [employer] and being worked for [employer]. Once approved they will be able to complete the order. Aware once we have further updates we will again reach back out.

## 2024-05-10 NOTE — Telephone Encounter (Signed)
 Any update on this CPAP order?

## 2024-05-10 NOTE — Telephone Encounter (Signed)
 Copied from CRM #[id number]. Topic: Clinical - Medical Advice >> May 07, 2024  4:55 PM Nathanel BROCKS wrote: Reason for CRM: pts daughter called about the cpap machine. Pt had a sleep study but she is yet to get the machine and the study was in September.  Can you please get in touch with pt and home health to get one ordered. >> May 10, 2024 11:46 AM Antwanette L wrote: Verneita, the patient's daughter, is calling back to inquire whether the provider will be prescribing a CPAP machine. The patient previously completed a sleep study in September. Verneita is requesting a callback at [phone]

## 2024-05-10 NOTE — Telephone Encounter (Signed)
 Copied from CRM [id number]. Topic: Clinical - Refused Triage >> May 10, 2024 11:49 AM Antwanette L wrote: Verneita, the patient's daughter, is calling to request an update on whether the patient will be receiving a CPAP machine. The patient completed a sleep study in September and is currently gasping for air at night. Verneita declined the offer to speak with a nurse

## 2024-05-10 NOTE — Telephone Encounter (Signed)
 See other phone encounter.

## 2024-05-15 NOTE — Progress Notes (Signed)
 Carol Armstrong                                          MRN: [id number]   05/15/2024   The [facility] Quality Team Specialist reviewed this patient medical record for the purposes of chart review for care gap closure. The following were reviewed: abstraction for care gap closure-kidney health evaluation for diabetes:eGFR  and uACR.    [facility]BCI Quality Team

## 2024-05-27 ENCOUNTER — Ambulatory Visit: Payer: Self-pay

## 2024-05-27 NOTE — Telephone Encounter (Signed)
 FYI Only or Action Required?: Action required by provider: clinical question for provider and update on patient condition. Family asking for updated on CPAP, if any oxygen can be prescribed in the mean time. Also asking if she needs sleep aide or if the insomnia is related to OSA untreated.  Patient was last seen in primary care on 04/15/2024 by Melvin Pao, NP.  Called Nurse Triage reporting Obstructive Sleep Apnea and Insomnia.  Symptoms began several months ago.  Interventions attempted: Other: sleeping with HOB propped up.  Symptoms are: gradually worsening.  Triage Disposition: Call PCP Now (overriding See PCP Within 2 Weeks)  Patient/caregiver understands and will follow disposition?: Yes         Copied from CRM #[id number]. Topic: Clinical - Medical Advice >> May 27, 2024 12:29 PM Nessti S wrote: Reason for CRM: pt daughter called in because pt is having trouble sleeping and she thinks because she has a hard time breathing. She would like an update on CPAP machine soon as possible. Call back number [phone] (H) >> May 27, 2024 12:31 PM Nessti S wrote: Also wanted to know if she is able to receive a temporary oxygen machine until she receive CPAP machine.  Reason for Disposition  Insomnia is a chronic symptom (recurrent or ongoing AND present > 4 weeks)  Answer Assessment - Initial Assessment Questions Daughter, Verneita, on the phone asking about update on the CPAP or if patient can be ordered oxygen to wear during the day or nighttime due to difficulty sleeping and family thinks related to her untreated sleep apnea.    1. DESCRIPTION: Tell me about your sleeping problem. (e.g., waking frequently during night, sleeping during day and awake at night, trouble falling asleep) How bad is it?      Daughter, Verneita, on the phone states patient is up around 4 am and has a hard time going back to sleep (up again by 06:30AM) and she wakes up coughing.  2. ONSET: How long  have you been having trouble sleeping? (e.g., days, weeks, months; longstanding sleep problems)     Daughter states that she was diagnosed with severe OSA at the end of September.  3. DAYTIME SLEEP PATTERN: How much time do you spend sleeping or napping during the day?     N/A.  4. STRESSORS: Is there anything that is making you feel stressed? Is there something that worries you?     N/A.  5. PAIN: Do you have any pain that is keeping you awake? (e.g., back pain, joint pain) If Yes, ask How bad is the pain? (e.g., scale 0-10; mild, moderate, severe).     No.  6. CAFFEINE: Do you drink caffeinated beverages? If Yes, ask How much each day? (e.g., coffee, tea, colas)     N/A.  7. ALCOHOL USE OR SUBSTANCE USE: Do you drink alcohol or use any substances?     N/A.  8. MEDICINE CHANGE: Has there been any recent change in medicines? (e.g., new medicine started, stopped, or dose changed).      N/A.  9. TREATMENT: What have you done so far to treat this sleep problem? (e.g., prescription or OTC sleep medicines, herbal or dietary supplements, cannabis, relaxation strategies)     Patient is still waiting to hear back on CPAP machine and daughter is requested if patient can be ordered oxygen during the day or at night time.   10. OTHER SYMPTOMS: Do you have any other symptoms?  (e.g., difficulty breathing)  Daughter is unsure if the patient is whistling/wheezing but describes it as a whistling noise while patient is breathing for a while.  Protocols used: Insomnia-A-AH

## 2024-05-28 NOTE — Telephone Encounter (Signed)
 Waiting on response from DME company in order to give proper update to patient.

## 2024-05-31 NOTE — Telephone Encounter (Unsigned)
 Copied from CRM #[id number]. Topic: Clinical - Lab/Test Results >> May 31, 2024  2:45 PM Everette C wrote: Reason for CRM: The patient's daughter has called to follow up on discussions and results regarding the patient's sleep study from 03/18/24. Please contact the patient's daughter further when possible to discuss the results and the patient's respiratory concerns and their potential need for a CPAP. Please contact further when possible

## 2024-06-04 NOTE — Telephone Encounter (Signed)
 Do we have any updates for this patient's CPAP?

## 2024-06-06 ENCOUNTER — Ambulatory Visit
Admission: RE | Admit: 2024-06-06 | Discharge: 2024-06-06 | Disposition: A | Discharge status: Home / Self Care | Source: Ambulatory Visit

## 2024-06-06 ENCOUNTER — Ambulatory Visit

## 2024-06-06 VITALS — BP 180/101 | HR 79 | Temp 98.2°F | Wt 188.5 lb

## 2024-06-06 DIAGNOSIS — S20212A Contusion of left front wall of thorax, initial encounter: Secondary | ICD-10-CM | POA: Diagnosis not present

## 2024-06-06 DIAGNOSIS — R0789 Other chest pain: Secondary | ICD-10-CM

## 2024-06-06 NOTE — Discharge Instructions (Addendum)
 Your x-ray did not show any evidence of broken ribs.  I do believe that you have bruised your chest wall as a result of your recent fall.  Use over-the-counter Tylenol  according to the package instructions to help with pain.  You may also apply topical Salonpas or over-the-counter topical lidocaine  patches directly to the area of pain.  Each patch is good for 8 hours.  Additionally, you may apply ice to your chest wall for 20 minutes at a time, 2-3 times a day, help with pain and inflammation.  If you develop any new or worsening symptoms please return for reevaluation or see your primary care provider.

## 2024-06-06 NOTE — ED Triage Notes (Signed)
 Pt is with her son  Pt c/o fall on 12.17.25  Pt son states that she missed a step while walking downstairs and landed on her left side.

## 2024-06-06 NOTE — Telephone Encounter (Unsigned)
 Copied from CRM #[id number]. Topic: Referral - Status >> Jun 06, 2024  3:32 PM Antony RAMAN wrote: Reason for CRM: DAUGHTER TERESA CALLED MEDICARE AND THEY SAID THEY NEED A REFERRAL FOR CPAP MACHINE  [phone]

## 2024-06-06 NOTE — Telephone Encounter (Signed)
 Spoke with patients daughter and asked that she contact her moms insurance to determine next steps in process due to requirements of insurance that are delaying the process.

## 2024-06-06 NOTE — ED Provider Notes (Signed)
 [facility]    CSN: [id number] Arrival date & time: 06/06/24  1608      History   Chief Complaint Chief Complaint  Patient presents with   Fall    HPI Carol Armstrong is a 83 y.o. female.   HPI  83 year old female with past medical hist significant for prediabetes, hypertension, arthritis, and mild cognitive impairment presents for evaluation of pain in her left ribs after suffering a fall yesterday.  She is here with her son and reports that she missed a step and landed on the left side of her chest yesterday.  No cough, shortness of breath, or hemoptysis.  No bruising noted.  Past Medical History:  Diagnosis Date   Arthritis    Hypertension    Prediabetes    Wears dentures    partial upper and lower    Patient Active Problem List   Diagnosis Date Noted   Aphasia 09/02/2022   PAD (peripheral artery disease) 12/27/2021   Mild cognitive impairment 05/28/2021   Diabetes mellitus type 2, uncomplicated (HCC) 12/17/2020   Forgetfulness 09/17/2020   Hypercholesteremia 03/05/2020   History of colonic polyps    Polyp of transverse colon    Essential hypertension 08/01/2019    Past Surgical History:  Procedure Laterality Date   COLONOSCOPY     COLONOSCOPY WITH PROPOFOL  N/A 10/07/2019   Procedure: COLONOSCOPY WITH PROPOFOL ;  Surgeon: Jinny Carmine, MD;  Location: [facility];  Service: Endoscopy;  Laterality: N/A;  Latex  priority 4   POLYPECTOMY  10/07/2019   Procedure: POLYPECTOMY;  Surgeon: Jinny Carmine, MD;  Location: [facility];  Service: Endoscopy;;   TUBAL LIGATION      OB History   No obstetric history on file.      Home Medications    Prior to Admission medications  Medication Sig Start Date End Date Taking? Authorizing Provider  amLODipine  (NORVASC ) 5 MG tablet Take 1 tablet by mouth daily 10/10/23  Yes Melvin Pao, NP  atorvastatin  (LIPITOR) 20 MG tablet TAKE 1 TABLET(20 MG) BY MOUTH DAILY 10/10/23  Yes Melvin Pao, NP  [place]-Vitamin C-Zinc (ELDERBERRY IMMUNE HEALTH GUMMY PO) Take 2 each by mouth daily. 03/31/24  Yes [provider]  Multiple Vitamin (MULTIVITAMIN) capsule Take 1 capsule by mouth daily.   Yes [provider]  triamcinolone  cream (KENALOG ) 0.1 % Apply 1 Application topically 2 (two) times daily. 04/22/24  Yes White, Shelba SAUNDERS, NP    Family History Family History  Problem Relation Age of Onset   Hypertension Mother    Prostate cancer Father    Hypertension Father    Hypertension Sister    Hypertension Brother    Hypertension Sister    Hypertension Sister    Hypertension Sister    Breast cancer Neg Hx     Social History Social History[1]   Allergies   Latex   Review of Systems Review of Systems  Respiratory:  Negative for cough and shortness of breath.   Cardiovascular:  Positive for chest pain.       Left rib pain  Skin:  Negative for color change.     Physical Exam Triage Vital Signs ED Triage Vitals  Encounter Vitals Group     BP      Girls Systolic BP Percentile      Girls Diastolic BP Percentile      Boys Systolic BP Percentile      Boys Diastolic BP Percentile      Pulse  Resp      Temp      Temp src      SpO2      Weight      Height      Head Circumference      Peak Flow      Pain Score      Pain Loc      Pain Education      Exclude from Growth Chart    No data found.  Updated Vital Signs BP (!) 180/101 (BP Location: Left Arm)   Pulse 79   Temp 98.2 F (36.8 C) (Oral)   Wt 188 lb 8 oz (85.5 kg)   SpO2 92%   BMI 30.42 kg/m   Visual Acuity Right Eye Distance:   Left Eye Distance:   Bilateral Distance:    Right Eye Near:   Left Eye Near:    Bilateral Near:     Physical Exam Vitals and nursing note reviewed.  Constitutional:      Appearance: Normal appearance. She is not ill-appearing.  HENT:     Head: Normocephalic and atraumatic.  Cardiovascular:     Rate and Rhythm: Normal rate and  regular rhythm.     Pulses: Normal pulses.     Heart sounds: Normal heart sounds. No murmur heard.    No friction rub. No gallop.  Pulmonary:     Effort: Pulmonary effort is normal.     Breath sounds: Normal breath sounds. No wheezing, rhonchi or rales.     Comments: Tenderness over the 9th and 10th rib.  No crepitus noted. Chest:     Chest wall: Tenderness present.  Skin:    General: Skin is warm and dry.     Capillary Refill: Capillary refill takes less than 2 seconds.     Findings: No bruising or erythema.  Neurological:     General: No focal deficit present.     Mental Status: She is alert.      UC Treatments / Results  Labs (all labs ordered are listed, but only abnormal results are displayed) Labs Reviewed - No data to display  EKG   Radiology DG Ribs Unilateral W/Chest Left Result Date: 06/06/2024 EXAM: 1 VIEW(S) XRAY OF THE LEFT RIBS AND CHEST 06/06/2024 06:17:31 PM COMPARISON: None available. CLINICAL HISTORY: Blunt force trauma to left chest. FINDINGS: BONES: No acute displaced rib fracture. Degenerative changes are seen within the cervicothoracic spine and left shoulder. LUNGS AND PLEURA: No consolidation or pulmonary edema. No pleural effusion or pneumothorax. HEART AND MEDIASTINUM: No acute abnormality of the cardiac and mediastinal silhouettes. IMPRESSION: 1. No acute left rib fracture. Electronically signed by: Dorethia Molt MD 06/06/2024 06:37 PM EST RP Workstation: [id number]    Procedures Procedures (including critical care time)  Medications Ordered in UC Medications - No data to display  Initial Impression / Assessment and Plan / UC Course  I have reviewed the triage vital signs and the nursing notes.  Pertinent labs & imaging results that were available during my care of the patient were reviewed by me and considered in my medical decision making (see chart for details).   Patient is a pleasant, nontoxic-appearing 83 year old female presenting for  evaluation of left-sided rib pain after suffering a fall yesterday as outlined in HPI above.  On exam the patient does have tenderness over her 8th and 9th rib on the left side but no appreciable crepitus.  No overlying ecchymosis, edema, or erythema.  Her lungs are clear to auscultation all  fields.  I suspect that the patient has contused her chest wall but I will obtain left rib series to evaluate for any evidence of rib injury.  Left rib films independent reviewed and evaluated by me.  Impression: No evidence of displaced rib fracture.  Lung fields are well aerated.  No evidence of hemo or pneumothorax.  Radiology overread is pending. Radiology impression states no acute rib fracture.  I will discharge patient with diagnosis of contusion of the left chest wall   Final Clinical Impressions(s) / UC Diagnoses   Final diagnoses:  Rib pain on left side  Contusion of left chest wall, initial encounter     Discharge Instructions      Your x-ray did not show any evidence of broken ribs.  I do believe that you have bruised your chest wall as a result of your recent fall.  Use over-the-counter Tylenol  according to the package instructions to help with pain.  You may also apply topical Salonpas or over-the-counter topical lidocaine  patches directly to the area of pain.  Each patch is good for 8 hours.  Additionally, you may apply ice to your chest wall for 20 minutes at a time, 2-3 times a day, help with pain and inflammation.  If you develop any new or worsening symptoms please return for reevaluation or see your primary care provider.     ED Prescriptions   None    PDMP not reviewed this encounter.     [1]  Social History Tobacco Use   Smoking status: Former   Smokeless tobacco: Never   Tobacco comments:    as teenager  Vaping Use   Vaping status: Never Used  Substance Use Topics   Alcohol use: Not Currently    Comment: occasinally wine   Drug use: Never     Bernardino Ditch, NP 06/06/24 1841

## 2024-06-10 ENCOUNTER — Other Ambulatory Visit: Payer: Self-pay | Admitting: Nurse Practitioner

## 2024-06-10 NOTE — Telephone Encounter (Signed)
 Copied from CRM #[id number]. Topic: Clinical - Medication Refill >> Jun 10, 2024 10:18 AM Eva FALCON wrote: Medication:  amLODipine  (NORVASC ) 5 MG tablet   Has the patient contacted their pharmacy? No, states no refill.  (Agent: If no, request that the patient contact the pharmacy for the refill. If patient does not wish to contact the pharmacy document the reason why and proceed with request.) (Agent: If yes, when and what did the pharmacy advise?)  This is the patient's preferred pharmacy:  [id number] [place], [place] - [address] AT [facility] : [facility] [address] [place] [address]   Is this the correct pharmacy for this prescription? Yes If no, delete pharmacy and type the correct one.   Has the prescription been filled recently? Yes  Is the patient out of the medication? No  Has the patient been seen for an appointment in the last year OR does the patient have an upcoming appointment? Yes  Can we respond through MyChart? No, prefer phone call.   Agent: Please be advised that Rx refills may take up to 3 business days. We ask that you follow-up with your pharmacy.

## 2024-06-12 MED ORDER — AMLODIPINE BESYLATE 5 MG PO TABS: ORAL_TABLET | ORAL | 0 refills | Status: AC

## 2024-06-12 NOTE — Telephone Encounter (Signed)
 Requested Prescriptions  Pending Prescriptions Disp Refills   amLODipine  (NORVASC ) 5 MG tablet 90 tablet 0    Sig: Take 1 tablet by mouth daily     Cardiovascular: Calcium  Channel Blockers 2 Failed - 06/12/2024 12:00 PM      Failed - Last BP in normal range    BP Readings from Last 1 Encounters:  06/06/24 (!) 180/101         Passed - Last Heart Rate in normal range    Pulse Readings from Last 1 Encounters:  06/06/24 79         Passed - Valid encounter within last 6 months    Recent Outpatient Visits           1 month ago Encounter for [employer] annual wellness exam   [place] [facility] Melvin Pao, NP   5 months ago Type 2 diabetes mellitus without complication, without long-term current use of insulin (HCC)   [facility] Melvin Pao, NP   8 months ago Essential hypertension   [facility] Melvin Pao, NP

## 2024-06-18 ENCOUNTER — Telehealth: Payer: Self-pay

## 2024-06-18 NOTE — Telephone Encounter (Signed)
 Copied from CRM [id number]. Topic: Clinical - Lab/Test Results >> Jun 18, 2024  2:27 PM Darshell M wrote: Reason for CRM: Patient's daughter Verneita [phone] calling.  Patient had sleep study in September. Patient was dx with severe sleep apnea following study. Per Medicaid provider needs to complete a referral or order to approve CPAP machine. Waiting on the provider to complete CPAP machine order. Patient daughter requesting this be done urgently as patient is not sleeping.

## 2024-06-19 ENCOUNTER — Encounter: Payer: Self-pay | Admitting: Nurse Practitioner

## 2024-06-21 NOTE — Telephone Encounter (Signed)
 Please see more updated threads.

## 2024-06-21 NOTE — Telephone Encounter (Signed)
 Order is in process directly with [facility]. Will contact patient daughter to let her know.

## 2024-06-24 NOTE — Telephone Encounter (Signed)
 Order is in process with insurance already and daugther Verneita and myself have been in direct communication.

## 2024-07-17 ENCOUNTER — Ambulatory Visit: Admitting: Nurse Practitioner

## 2024-07-17 ENCOUNTER — Encounter: Payer: Self-pay | Admitting: Nurse Practitioner

## 2024-07-17 VITALS — BP 139/82 | HR 92 | Temp 97.9°F | Ht 65.98 in | Wt 185.6 lb

## 2024-07-17 DIAGNOSIS — E7800 Pure hypercholesterolemia, unspecified: Secondary | ICD-10-CM

## 2024-07-17 DIAGNOSIS — G3184 Mild cognitive impairment, so stated: Secondary | ICD-10-CM

## 2024-07-17 DIAGNOSIS — I10 Essential (primary) hypertension: Secondary | ICD-10-CM

## 2024-07-17 DIAGNOSIS — E119 Type 2 diabetes mellitus without complications: Secondary | ICD-10-CM

## 2024-07-17 DIAGNOSIS — Z23 Encounter for immunization: Secondary | ICD-10-CM | POA: Diagnosis not present

## 2024-07-17 DIAGNOSIS — G4733 Obstructive sleep apnea (adult) (pediatric): Secondary | ICD-10-CM | POA: Diagnosis not present

## 2024-07-17 MED ORDER — ATORVASTATIN CALCIUM 20 MG PO TABS: ORAL_TABLET | ORAL | 1 refills | Status: AC

## 2024-07-17 NOTE — Assessment & Plan Note (Signed)
 Chronic. Working to get patient CPAP. Insurance has been very difficult to work with. Information given to patient's son on who to reach out to to arrange delivery.

## 2024-07-17 NOTE — Assessment & Plan Note (Signed)
 Chronic.  No longer taking Donzepil.  Has decided she doesn't want to take more medication.  She continues to enjoy going to church and her bible study.  She lives with her son, often visits daughter and granddaughter.  Follow up in 3 months.  Call sooner if concerns arise.

## 2024-07-17 NOTE — Assessment & Plan Note (Signed)
Chronic.  Controlled.  Continue with current medication regimen on Atorvastatin '20mg'$  daily.  Labs ordered today.  Return to clinic in 3 months for reevaluation.  Call sooner if concerns arise.  ? ?

## 2024-07-17 NOTE — Assessment & Plan Note (Signed)
 Chronic.  Controlled.  Continue with current medication regimen of Amlodipine .  Refills sent today.  Labs ordered today.  Return to clinic in 3 months for reevaluation.  Call sooner if concerns arise.

## 2024-07-17 NOTE — Progress Notes (Signed)
 "  BP 139/82 (BP Location: Right Arm, Patient Position: Sitting, Cuff Size: Large)   Pulse 92   Temp 97.9 F (36.6 C) (Oral)   Ht 5' 5.98 (1.676 m)   Wt 185 lb 9.6 oz (84.2 kg)   SpO2 90%   BMI 29.97 kg/m    Subjective:    Patient ID: Carol Armstrong, female    DOB: [birth year], 84 y.o.   MRN: [id number]  HPI: Carol Armstrong is a 84 y.o. female  Chief Complaint  Patient presents with   office visit    3 month f/u   HYPERTENSION / HYPERLIPIDEMIA Satisfied with current treatment? yes Duration of hypertension: years BP monitoring frequency: not checking BP range:  BP medication side effects: no Past BP meds: amlodipine  Duration of hyperlipidemia: years Cholesterol medication side effects: no Cholesterol supplements: none Past cholesterol medications: atorvastain (lipitor) Medication compliance: excellent compliance Aspirin: no Recent stressors: no Recurrent headaches: no Visual changes: no Palpitations: no Dyspnea: no Chest pain: no Lower extremity edema: no Dizzy/lightheaded: no  DIABETES Not taking any medication.   Hypoglycemic episodes:no Polydipsia/polyuria: no Visual disturbance: no Chest pain: no Paresthesias: no Glucose Monitoring: no  Accucheck frequency: Not Checking  Fasting glucose:  Post prandial:  Evening:  Before meals: Taking Insulin?: no  Long acting insulin:  Short acting insulin: Blood Pressure Monitoring: not checking Retinal Examination: Up to Date Foot Exam: Up to Date Diabetic Education: Not Completed Pneumovax: Not up to Date Influenza: Not up to Date Aspirin: no  MEMORY Patient feels like her symptoms have worsened some.  Her son is at her appointment with her today and he agrees that her memory has declined some.  Patient is not taking the Donepezil .  She has decided she just didn't want to take the medication.  SLEEP APNEA Sleep apnea status: uncontrolled Duration: months Satisfied with current treatment?:  no CPAP use:   hasn't received her supplies yet. Has been very difficult to get with her insurance Sleep quality with CPAP use: good, average Treament compliance:excellent compliance Last sleep study: November 2025     Relevant past medical, surgical, family and social history reviewed and updated as indicated. Interim medical history since our last visit reviewed. Allergies and medications reviewed and updated.  Review of Systems  Eyes:  Negative for visual disturbance.  Respiratory:  Positive for apnea. Negative for cough, chest tightness and shortness of breath.   Cardiovascular:  Negative for chest pain, palpitations and leg swelling.  Endocrine: Negative for polydipsia and polyuria.  Neurological:  Positive for speech difficulty. Negative for dizziness, numbness and headaches.       Memory deficit    Per HPI unless specifically indicated above     Objective:    BP 139/82 (BP Location: Right Arm, Patient Position: Sitting, Cuff Size: Large)   Pulse 92   Temp 97.9 F (36.6 C) (Oral)   Ht 5' 5.98 (1.676 m)   Wt 185 lb 9.6 oz (84.2 kg)   SpO2 90%   BMI 29.97 kg/m   Wt Readings from Last 3 Encounters:  07/17/24 185 lb 9.6 oz (84.2 kg)  06/06/24 188 lb 8 oz (85.5 kg)  04/15/24 185 lb 3.2 oz (84 kg)    Physical Exam Vitals and nursing note reviewed.  Constitutional:      General: She is not in acute distress.    Appearance: Normal appearance. She is normal weight. She is not ill-appearing, toxic-appearing or diaphoretic.  HENT:     Head: Normocephalic.  Right Ear: External ear normal.     Left Ear: External ear normal.     Nose: Nose normal.     Mouth/Throat:     Mouth: Mucous membranes are moist.     Pharynx: Oropharynx is clear.  Eyes:     General:        Right eye: No discharge.        Left eye: No discharge.     Extraocular Movements: Extraocular movements intact.     Conjunctiva/sclera: Conjunctivae normal.     Pupils: Pupils are equal, round, and reactive to light.   Cardiovascular:     Rate and Rhythm: Normal rate and regular rhythm.     Heart sounds: No murmur heard. Pulmonary:     Effort: Pulmonary effort is normal. No respiratory distress.     Breath sounds: Normal breath sounds. No wheezing or rales.  Musculoskeletal:     Cervical back: Normal range of motion and neck supple.  Skin:    General: Skin is warm and dry.     Capillary Refill: Capillary refill takes less than 2 seconds.  Neurological:     General: No focal deficit present.     Mental Status: She is alert and oriented to person, place, and time. Mental status is at baseline.     Comments: Hesitancy with speech.  Studer at times  Psychiatric:        Mood and Affect: Mood normal.        Behavior: Behavior normal.        Thought Content: Thought content normal.        Judgment: Judgment normal.     Results for orders placed or performed in visit on 04/15/24  Comp Met (CMET)   Collection Time: 04/15/24  1:57 PM  Result Value Ref Range   Glucose 97 70 - 99 mg/dL   BUN 15 8 - 27 mg/dL   Creatinine, Ser 9.27 0.57 - 1.00 mg/dL   eGFR 84 >40 fO/fpw/8.26   BUN/Creatinine Ratio 21 12 - 28   Sodium 142 134 - 144 mmol/L   Potassium 4.1 3.5 - 5.2 mmol/L   Chloride 104 96 - 106 mmol/L   CO2 25 20 - 29 mmol/L   Calcium  9.2 8.7 - 10.3 mg/dL   Total Protein 6.6 6.0 - 8.5 g/dL   Albumin 4.0 3.7 - 4.7 g/dL   Globulin, Total 2.6 1.5 - 4.5 g/dL   Bilirubin Total 0.9 0.0 - 1.2 mg/dL   Alkaline Phosphatase 74 48 - 129 IU/L   AST 24 0 - 40 IU/L   ALT 22 0 - 32 IU/L  HgB A1c   Collection Time: 04/15/24  1:57 PM  Result Value Ref Range   Hgb A1c MFr Bld 6.1 (H) 4.8 - 5.6 %   Est. average glucose Bld gHb Est-mCnc 128 mg/dL  Microalbumin, Urine Waived   Collection Time: 04/18/24  1:56 PM  Result Value Ref Range   Microalb, Ur Waived 10 0 - 19 mg/L   Creatinine, Urine Waived 10 10 - 300 mg/dL   Microalb/Creat Ratio 30-300 (H) <30 mg/g      Assessment & Plan:   Problem List Items  Addressed This Visit       Cardiovascular and Mediastinum   Essential hypertension   Chronic.  Controlled.  Continue with current medication regimen of Amlodipine .  Refills sent today.  Labs ordered today.  Return to clinic in 3 months for reevaluation.  Call sooner if concerns arise.  Relevant Medications   atorvastatin  (LIPITOR) 20 MG tablet     Respiratory   Obstructive sleep apnea   Chronic. Working to get patient CPAP. Insurance has been very difficult to work with. Information given to patient's son on who to reach out to to arrange delivery.        Endocrine   Diabetes mellitus type 2, uncomplicated (HCC) - Primary   Chronic.  Controlled.  Last A1c was 6.1%.  Microalbumin today.  Well controlled with diet management.  Controlled without medication.  Labs ordered today.  Return to clinic in 3 months for reevaluation.  Call sooner if concerns arise.       Relevant Medications   atorvastatin  (LIPITOR) 20 MG tablet   Other Relevant Orders   Comprehensive metabolic panel with GFR   Hemoglobin A1c   Microalbumin, Urine Waived     Other   Hypercholesteremia   Chronic.  Controlled.  Continue with current medication regimen on Atorvastatin  20mg  daily.  Labs ordered today.  Return to clinic in 3 months for reevaluation.  Call sooner if concerns arise.       Relevant Medications   atorvastatin  (LIPITOR) 20 MG tablet   Other Relevant Orders   Lipid panel   Mild cognitive impairment   Chronic.  No longer taking Donzepil.  Has decided she doesn't want to take more medication.  She continues to enjoy going to church and her bible study.  She lives with her son, often visits daughter and granddaughter.  Follow up in 3 months.  Call sooner if concerns arise.       Other Visit Diagnoses       Need for COVID-19 vaccine       Relevant Orders   [employer] Comirnaty Covid-19 Vaccine 85yrs & older (Completed)          Follow up plan: No follow-ups on file.       "

## 2024-07-17 NOTE — Assessment & Plan Note (Signed)
 Chronic.  Controlled.  Last A1c was 6.1%.  Microalbumin today.  Well controlled with diet management.  Controlled without medication.  Labs ordered today.  Return to clinic in 3 months for reevaluation.  Call sooner if concerns arise.

## 2024-07-17 NOTE — Patient Instructions (Signed)
 Please reach out to [facility]- Call us  toll free at [phone]

## 2024-07-18 ENCOUNTER — Ambulatory Visit: Payer: Self-pay | Admitting: Nurse Practitioner

## 2024-07-18 LAB — COMPREHENSIVE METABOLIC PANEL WITH GFR
ALT: 21 [IU]/L (ref 0–32)
AST: 25 [IU]/L (ref 0–40)
Albumin: 4.2 g/dL (ref 3.7–4.7)
Alkaline Phosphatase: 71 [IU]/L (ref 48–129)
BUN/Creatinine Ratio: 19 (ref 12–28)
BUN: 15 mg/dL (ref 8–27)
Bilirubin Total: 0.8 mg/dL (ref 0.0–1.2)
CO2: 22 mmol/L (ref 20–29)
Calcium: 9.1 mg/dL (ref 8.7–10.3)
Chloride: 107 mmol/L — ABNORMAL HIGH (ref 96–106)
Creatinine, Ser: 0.81 mg/dL (ref 0.57–1.00)
Globulin, Total: 2.5 g/dL (ref 1.5–4.5)
Glucose: 110 mg/dL — ABNORMAL HIGH (ref 70–99)
Potassium: 4.3 mmol/L (ref 3.5–5.2)
Sodium: 144 mmol/L (ref 134–144)
Total Protein: 6.7 g/dL (ref 6.0–8.5)
eGFR: 73 mL/min/{1.73_m2}

## 2024-07-18 LAB — LIPID PANEL
Chol/HDL Ratio: 1.9 ratio (ref 0.0–4.4)
Cholesterol, Total: 134 mg/dL (ref 100–199)
HDL: 69 mg/dL
LDL Chol Calc (NIH): 51 mg/dL (ref 0–99)
Triglycerides: 72 mg/dL (ref 0–149)
VLDL Cholesterol Cal: 14 mg/dL (ref 5–40)

## 2024-07-18 LAB — HEMOGLOBIN A1C
Est. average glucose Bld gHb Est-mCnc: 131 mg/dL
Hgb A1c MFr Bld: 6.2 % — ABNORMAL HIGH (ref 4.8–5.6)

## 2024-07-19 LAB — MICROALBUMIN, URINE WAIVED
Creatinine, Urine Waived: 100 mg/dL (ref 10–300)
Microalb, Ur Waived: 10 mg/L (ref 0–19)
Microalb/Creat Ratio: <30 mg/g

## 2024-10-15 ENCOUNTER — Ambulatory Visit: Admitting: Nurse Practitioner
